# Patient Record
Sex: Female | Born: 1985
Health system: Southern US, Community
[De-identification: ages and names within clinical notes are randomized; demographics above are authoritative.]

## PROBLEM LIST (undated history)

## (undated) DIAGNOSIS — Z789 Other specified health status: Secondary | ICD-10-CM

## (undated) DIAGNOSIS — Z8619 Personal history of other infectious and parasitic diseases: Secondary | ICD-10-CM

## (undated) HISTORY — PX: WISDOM TOOTH EXTRACTION: SHX21

---

## 2012-01-19 LAB — OB RESULTS CONSOLE ABO/RH: RH Type: POSITIVE

## 2012-01-19 LAB — OB RESULTS CONSOLE ANTIBODY SCREEN: Antibody Screen: NEGATIVE

## 2012-01-19 LAB — OB RESULTS CONSOLE RPR: RPR: NONREACTIVE

## 2012-05-28 ENCOUNTER — Emergency Department (HOSPITAL_COMMUNITY)
Admission: EM | Admit: 2012-05-28 | Discharge: 2012-05-28 | Disposition: A | Discharge status: Home / Self Care | Payer: 59 | Attending: Emergency Medicine | Admitting: Emergency Medicine

## 2012-05-28 ENCOUNTER — Encounter (HOSPITAL_COMMUNITY): Payer: Self-pay | Admitting: Emergency Medicine

## 2012-05-28 DIAGNOSIS — Z3490 Encounter for supervision of normal pregnancy, unspecified, unspecified trimester: Secondary | ICD-10-CM

## 2012-05-28 DIAGNOSIS — R059 Cough, unspecified: Secondary | ICD-10-CM | POA: Insufficient documentation

## 2012-05-28 DIAGNOSIS — J069 Acute upper respiratory infection, unspecified: Secondary | ICD-10-CM

## 2012-05-28 DIAGNOSIS — R6883 Chills (without fever): Secondary | ICD-10-CM | POA: Insufficient documentation

## 2012-05-28 DIAGNOSIS — R05 Cough: Secondary | ICD-10-CM | POA: Insufficient documentation

## 2012-05-28 DIAGNOSIS — O9989 Other specified diseases and conditions complicating pregnancy, childbirth and the puerperium: Secondary | ICD-10-CM | POA: Insufficient documentation

## 2012-05-28 DIAGNOSIS — IMO0001 Reserved for inherently not codable concepts without codable children: Secondary | ICD-10-CM | POA: Insufficient documentation

## 2012-05-28 DIAGNOSIS — J029 Acute pharyngitis, unspecified: Secondary | ICD-10-CM | POA: Insufficient documentation

## 2012-05-28 DIAGNOSIS — J3489 Other specified disorders of nose and nasal sinuses: Secondary | ICD-10-CM | POA: Insufficient documentation

## 2012-05-28 MED ORDER — OSELTAMIVIR PHOSPHATE 75 MG PO CAPS: 75.0000 mg | ORAL_CAPSULE | Freq: Two times a day (BID) | ORAL | Status: DC

## 2012-05-28 NOTE — ED Provider Notes (Signed)
Medical screening examination/treatment/procedure(s) were conducted as a shared visit with non-physician practitioner(s) and myself.  I personally evaluated the patient during the encounter Patient with flulike symptoms here who is [redacted] weeks pregnant. Bedside ultrasound showed normal fetal movement and normal heart rate of 140s. Patient was started on Tamiflu and given close followup with PCP  Gwyneth Sprout, MD 05/28/12 [phone]

## 2012-05-28 NOTE — ED Provider Notes (Signed)
History     CSN: [id number]  Arrival date & time 05/28/12  4098   First MD Initiated Contact with Patient 05/28/12 0945      No chief complaint on file.   (Consider location/radiation/quality/duration/timing/severity/associated sxs/prior treatment) HPI Britini E Schlueter is a 26 y.o. female who presents to ED complaining of URI symptoms. Pt reports nasal congestion, sore throat, dry non productive cough, chills at home. Did not take her temp. States took tylenol right prior to coming in. Pt states she is [redacted]wks pregnant. 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 States called her doctor, was told to come here for evaluation. Pt denies any n/v/d. No abdominal pain. Feels her baby moving well. No vaginal discharge, bleeding. No headache, nuchal rigidity. No other complaints.  History reviewed. No pertinent past medical history.  No past surgical history on file.  History reviewed. No pertinent family history.  History  Substance Use Topics  . Smoking status: Not on file  . Smokeless tobacco: Not on file  . Alcohol Use: Not on file    OB History    Grav Para Term Preterm Abortions TAB SAB Ect Mult Living                  Review of Systems  Constitutional: Positive for chills.  HENT: Positive for congestion and sore throat. Negative for ear pain, neck pain and neck stiffness.   Respiratory: Positive for cough. Negative for shortness of breath and wheezing.   Cardiovascular: Negative.   Gastrointestinal: Negative.   Genitourinary: Negative for dysuria and flank pain.  Musculoskeletal: Positive for myalgias.  Skin: Negative for rash.  Neurological: Negative for dizziness, weakness and headaches.    Allergies  Review of patient's allergies indicates no known allergies.  Home Medications  No current outpatient prescriptions on file.  BP 126/76  Pulse 110  Temp 98.7 F (37.1 C) (Oral)  Resp 16  SpO2 100%  LMP 11/14/2011  Physical Exam   Nursing note and vitals reviewed. Constitutional: She appears well-developed and well-nourished. No distress.  HENT:  Right Ear: Tympanic membrane, external ear and ear canal normal.  Left Ear: Tympanic membrane, external ear and ear canal normal.  Nose: Rhinorrhea present.  Mouth/Throat: Uvula is midline and mucous membranes are normal. Posterior oropharyngeal erythema present. No oropharyngeal exudate, posterior oropharyngeal edema or tonsillar abscesses.  Eyes: Conjunctivae normal are normal. Pupils are equal, round, and reactive to light.  Neck: Normal range of motion. Neck supple.  Cardiovascular: Normal rate, regular rhythm and normal heart sounds.   Pulmonary/Chest: Effort normal and breath sounds normal. No respiratory distress. She has no wheezes. She has no rales.  Abdominal: Soft. Bowel sounds are normal. There is no tenderness. There is no rebound and no guarding.       gravid  Musculoskeletal: She exhibits no edema.  Lymphadenopathy:    She has no cervical adenopathy.  Neurological: She is alert.  Skin: Skin is warm and dry.  Psychiatric: She has a normal mood and affect.    ED Course  Procedures (including critical care time)    1. Viral URI   2. Pregnancy       MDM  Pt with flu like symptoms. No exam findings suggestive of pneumonia, meningitis, or any other emergent condition. Most likely viral URI vs flu, pt is at risk given pt's exposure. She did have her flu shot this year. Given she is pregnant, will treat with tamiflu. Supportive treatment,  oral fluids at home. She is non toxic appearing. Bed side [place] used by dr. Madlyn Frankel to assess baby's fetal heart rate, and it was normal in 140-150s. Will d/c home with work note and follow up.           Lottie Mussel, PA 05/28/12 1559

## 2012-05-28 NOTE — ED Notes (Signed)
Pt  Here with c/o flu like symptoms x1 day. Reports runny nose, dry cough, sore throat. Pt reports pregnant x28 weeks.

## 2012-07-12 ENCOUNTER — Inpatient Hospital Stay (HOSPITAL_COMMUNITY): Admission: AD | Admit: 2012-07-12 | Payer: 59 | Source: Ambulatory Visit | Admitting: Obstetrics & Gynecology

## 2012-07-25 LAB — OB RESULTS CONSOLE GBS: GBS: NEGATIVE

## 2012-08-19 ENCOUNTER — Inpatient Hospital Stay (HOSPITAL_COMMUNITY)
Admission: RE | Admit: 2012-08-19 | Discharge: 2012-08-22 | DRG: 765 | Disposition: A | Discharge status: Home / Self Care | Payer: 59 | Source: Ambulatory Visit | Attending: Obstetrics & Gynecology | Admitting: Obstetrics & Gynecology

## 2012-08-19 ENCOUNTER — Encounter (HOSPITAL_COMMUNITY): Payer: Self-pay

## 2012-08-19 DIAGNOSIS — D689 Coagulation defect, unspecified: Secondary | ICD-10-CM | POA: Diagnosis not present

## 2012-08-19 DIAGNOSIS — O403XX0 Polyhydramnios, third trimester, not applicable or unspecified: Secondary | ICD-10-CM | POA: Diagnosis present

## 2012-08-19 DIAGNOSIS — O324XX0 Maternal care for high head at term, not applicable or unspecified: Secondary | ICD-10-CM | POA: Diagnosis present

## 2012-08-19 DIAGNOSIS — D696 Thrombocytopenia, unspecified: Secondary | ICD-10-CM | POA: Diagnosis not present

## 2012-08-19 DIAGNOSIS — O409XX0 Polyhydramnios, unspecified trimester, not applicable or unspecified: Secondary | ICD-10-CM | POA: Diagnosis present

## 2012-08-19 HISTORY — DX: Other specified health status: Z78.9

## 2012-08-19 LAB — CBC
MCH: 31.7 pg (ref 26.0–34.0)
Platelets: 125 10*3/uL — ABNORMAL LOW (ref 150–400)
RBC: 4.1 MIL/uL (ref 3.87–5.11)
WBC: 9.7 10*3/uL (ref 4.0–10.5)

## 2012-08-19 MED ORDER — OXYTOCIN BOLUS FROM INFUSION: 500.0000 mL | INTRAVENOUS | Status: DC

## 2012-08-19 MED ORDER — TERBUTALINE SULFATE 1 MG/ML IJ SOLN
0.2500 mg | Freq: Once | INTRAMUSCULAR | Status: AC | PRN
  Filled 2012-08-19: qty 1

## 2012-08-19 MED ORDER — DIPHENHYDRAMINE HCL 25 MG PO CAPS
25.0000 mg | ORAL_CAPSULE | Freq: Every evening | ORAL | Status: DC | PRN
  Administered 2012-08-20: 25 mg via ORAL
  Filled 2012-08-19 (×2): qty 1

## 2012-08-19 MED ORDER — CALCIUM CARBONATE ANTACID 500 MG PO CHEW
1.0000 | CHEWABLE_TABLET | Freq: Three times a day (TID) | ORAL | Status: DC
  Administered 2012-08-19 – 2012-08-21 (×5): 200 mg via ORAL
  Filled 2012-08-19 (×4): qty 1
  Filled 2012-08-19: qty 2

## 2012-08-19 MED ORDER — ONDANSETRON HCL 4 MG/2ML IJ SOLN
4.0000 mg | Freq: Four times a day (QID) | INTRAMUSCULAR | Status: DC | PRN
  Administered 2012-08-20: 4 mg via INTRAVENOUS
  Filled 2012-08-19: qty 2

## 2012-08-19 MED ORDER — LACTATED RINGERS IV SOLN
INTRAVENOUS | Status: DC
  Administered 2012-08-19: 20:00:00 via INTRAVENOUS
  Administered 2012-08-20: 125 mL/h via INTRAVENOUS
  Administered 2012-08-20: 03:00:00 via INTRAVENOUS

## 2012-08-19 MED ORDER — CITRIC ACID-SODIUM CITRATE 334-500 MG/5ML PO SOLN
30.0000 mL | ORAL | Status: DC | PRN
  Administered 2012-08-20: 30 mL via ORAL
  Filled 2012-08-19: qty 15

## 2012-08-19 MED ORDER — ACETAMINOPHEN 325 MG PO TABS: 650.0000 mg | ORAL_TABLET | ORAL | Status: DC | PRN

## 2012-08-19 MED ORDER — IBUPROFEN 600 MG PO TABS: 600.0000 mg | ORAL_TABLET | Freq: Four times a day (QID) | ORAL | Status: DC | PRN

## 2012-08-19 MED ORDER — LIDOCAINE HCL (PF) 1 % IJ SOLN
30.0000 mL | INTRAMUSCULAR | Status: DC | PRN
  Filled 2012-08-19: qty 30

## 2012-08-19 MED ORDER — OXYTOCIN 40 UNITS IN LACTATED RINGERS INFUSION - SIMPLE MED
62.5000 mL/h | INTRAVENOUS | Status: DC
  Filled 2012-08-19: qty 1000

## 2012-08-19 MED ORDER — OXYCODONE-ACETAMINOPHEN 5-325 MG PO TABS: 1.0000 | ORAL_TABLET | ORAL | Status: DC | PRN

## 2012-08-19 MED ORDER — ZOLPIDEM TARTRATE 5 MG PO TABS: 5.0000 mg | ORAL_TABLET | Freq: Every evening | ORAL | Status: DC | PRN

## 2012-08-19 MED ORDER — LACTATED RINGERS IV SOLN: 500.0000 mL | INTRAVENOUS | Status: DC | PRN

## 2012-08-19 MED ORDER — MISOPROSTOL 25 MCG QUARTER TABLET
25.0000 ug | ORAL_TABLET | ORAL | Status: DC | PRN
  Administered 2012-08-19 – 2012-08-20 (×2): 25 ug via VAGINAL
  Filled 2012-08-19 (×2): qty 0.25

## 2012-08-19 NOTE — H&P (Signed)
Gerardine E Holst is a 27 y.o. 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  Uncomplicated PNCare, all nl labs, no GDM. Noted S>D and tense uterus for which sono was done, AFI 26 cm at 37 wks and went down to 23 cm at 39.5 wks.  Back pain and LE swelling, GERD; otherwise no bleeding/leaking/contractions. Feels lot of FMs.   History OB History   Grav Para Term Preterm Abortions TAB SAB Ect Mult Living   1              Past Medical History  Diagnosis Date  . Medical history non-contributory    Past Surgical History  Procedure Laterality Date  . Wisdom tooth extraction     Family History: family history is not on file. Social History:  reports that she has never smoked. She has never used smokeless tobacco. She reports that she does not drink alcohol or use illicit drugs.   Prenatal Transfer Tool  Maternal Diabetes: No Genetic Screening: Normal Maternal Ultrasounds/Referrals: Abnormal:  Findings:   Other: Polyhydramnios noted at 37 wks, normal anatomy Fetal Ultrasounds or other Referrals:  None Maternal Substance Abuse:  No Significant Maternal Medications:  None Significant Maternal Lab Results:  Lab values include: Group B Strep negative  Review of Systems  Eyes: Negative for blurred vision.  Respiratory: Negative for shortness of breath.   Cardiovascular: Negative for chest pain.  Gastrointestinal: Positive for heartburn.  Musculoskeletal: Positive for back pain.  Neurological: Negative for headaches.  Psychiatric/Behavioral: Negative for depression.    Exam Physical Exam  Blood pressure 133/93, pulse 103, temperature 98 F (36.7 C), temperature source Oral, resp. rate 18, height 5\' 6"  (1.676 m), weight 149 lb (67.586 kg), last menstrual period 11/14/2011.  A&O x 3, no acute distress. Pleasant HEENT neg Lungs CTA bilat CV RRR, S1S2 normal Abdo soft, non tender, non acute Extr +2 edema,  no tenderness Pelvic Cx closed/long/ stn -4/ Vtx  (confirmed with bedside sono). Cytotec 25 mcg placed in vagina FHT  160s/ + accels/ no decels/ moderate variability- category I Toco none  Prenatal labs: ABO, Rh: A/Positive/-- (08/14 0000) Antibody: Negative (08/14 0000) Rubella: Immune (08/14 0000) RPR: Nonreactive (08/14 0000)  HBsAg: Negative (08/14 0000)  HIV: Non-reactive (08/14 0000)  GBS: Negative (02/18 0000)  Glucola nl Ultrascreen neg, AFP1 normal. Anatomy sono - female, normal, good interval growth, last sono 3/15 suspected 8 +lbs.    Assessment/Plan:  27 yo G1 at 40 wks (LMP c/w sono), with idiopathic polyhydramnios, here for IOL. Cytotec to ripening, pitocin when cx improved. Plan early controlled AROM to prevent cord prolapse and abruption, patient understands and agrees.    Quantisha Marsicano R 08/19/2012, 9:26 PM

## 2012-08-20 ENCOUNTER — Encounter (HOSPITAL_COMMUNITY): Payer: Self-pay | Admitting: Anesthesiology

## 2012-08-20 ENCOUNTER — Inpatient Hospital Stay (HOSPITAL_COMMUNITY): Payer: 59 | Admitting: Anesthesiology

## 2012-08-20 ENCOUNTER — Encounter (HOSPITAL_COMMUNITY)
Admission: RE | Disposition: A | Discharge status: Home / Self Care | Payer: Self-pay | Source: Ambulatory Visit | Attending: Obstetrics & Gynecology

## 2012-08-20 ENCOUNTER — Encounter (HOSPITAL_COMMUNITY): Payer: Self-pay

## 2012-08-20 DIAGNOSIS — O403XX0 Polyhydramnios, third trimester, not applicable or unspecified: Secondary | ICD-10-CM | POA: Diagnosis present

## 2012-08-20 LAB — CBC
HCT: 38.7 % (ref 36.0–46.0)
MCH: 31.2 pg (ref 26.0–34.0)
MCHC: 33.8 g/dL (ref 30.0–36.0)
MCV: 91.3 fL (ref 78.0–100.0)
MCV: 92.3 fL (ref 78.0–100.0)
Platelets: 106 10*3/uL — ABNORMAL LOW (ref 150–400)
Platelets: 108 10*3/uL — ABNORMAL LOW (ref 150–400)
RBC: 4.24 MIL/uL (ref 3.87–5.11)
WBC: 12 10*3/uL — ABNORMAL HIGH (ref 4.0–10.5)

## 2012-08-20 LAB — TYPE AND SCREEN: ABO/RH(D): A POS

## 2012-08-20 LAB — RPR: RPR Ser Ql: NONREACTIVE

## 2012-08-20 SURGERY — Surgical Case
Anesthesia: Epidural | Wound class: Clean Contaminated

## 2012-08-20 MED ORDER — KETOROLAC TROMETHAMINE 60 MG/2ML IM SOLN: 60.0000 mg | Freq: Once | INTRAMUSCULAR | Status: DC | PRN

## 2012-08-20 MED ORDER — MENTHOL 3 MG MT LOZG: 1.0000 | LOZENGE | OROMUCOSAL | Status: DC | PRN

## 2012-08-20 MED ORDER — MORPHINE SULFATE 0.5 MG/ML IJ SOLN
INTRAMUSCULAR | Status: AC
  Filled 2012-08-20: qty 10

## 2012-08-20 MED ORDER — BUTORPHANOL TARTRATE 1 MG/ML IJ SOLN
1.0000 mg | INTRAMUSCULAR | Status: DC | PRN
  Administered 2012-08-20: 1 mg via INTRAVENOUS
  Filled 2012-08-20 (×2): qty 1

## 2012-08-20 MED ORDER — KETOROLAC TROMETHAMINE 30 MG/ML IJ SOLN: 30.0000 mg | Freq: Four times a day (QID) | INTRAMUSCULAR | Status: DC | PRN

## 2012-08-20 MED ORDER — DEXAMETHASONE SODIUM PHOSPHATE 10 MG/ML IJ SOLN
INTRAMUSCULAR | Status: DC | PRN
  Administered 2012-08-20: 10 mg via INTRAVENOUS

## 2012-08-20 MED ORDER — SODIUM CHLORIDE 0.9 % IJ SOLN
3.0000 mL | INTRAMUSCULAR | Status: DC | PRN
  Administered 2012-08-21: 3 mL via INTRAVENOUS

## 2012-08-20 MED ORDER — SCOPOLAMINE 1 MG/3DAYS TD PT72
1.0000 | MEDICATED_PATCH | Freq: Once | TRANSDERMAL | Status: DC
  Administered 2012-08-20: 1.5 mg via TRANSDERMAL

## 2012-08-20 MED ORDER — CEFAZOLIN SODIUM-DEXTROSE 2-3 GM-% IV SOLR
2.0000 g | INTRAVENOUS | Status: AC
  Administered 2012-08-20: 2 g via INTRAVENOUS
  Filled 2012-08-20: qty 50

## 2012-08-20 MED ORDER — SIMETHICONE 80 MG PO CHEW
80.0000 mg | CHEWABLE_TABLET | Freq: Three times a day (TID) | ORAL | Status: DC
  Administered 2012-08-20 – 2012-08-22 (×6): 80 mg via ORAL

## 2012-08-20 MED ORDER — FAMOTIDINE 20 MG PO TABS
20.0000 mg | ORAL_TABLET | Freq: Every day | ORAL | Status: DC | PRN
  Filled 2012-08-20: qty 1

## 2012-08-20 MED ORDER — MEPERIDINE HCL 25 MG/ML IJ SOLN: 6.2500 mg | INTRAMUSCULAR | Status: DC | PRN

## 2012-08-20 MED ORDER — FENTANYL CITRATE 0.05 MG/ML IJ SOLN: 25.0000 ug | INTRAMUSCULAR | Status: DC | PRN

## 2012-08-20 MED ORDER — SENNOSIDES-DOCUSATE SODIUM 8.6-50 MG PO TABS
2.0000 | ORAL_TABLET | Freq: Every day | ORAL | Status: DC
  Administered 2012-08-20 – 2012-08-21 (×2): 2 via ORAL

## 2012-08-20 MED ORDER — ZOLPIDEM TARTRATE 5 MG PO TABS: 5.0000 mg | ORAL_TABLET | Freq: Every evening | ORAL | Status: DC | PRN

## 2012-08-20 MED ORDER — FENTANYL 2.5 MCG/ML BUPIVACAINE 1/10 % EPIDURAL INFUSION (WH - ANES): 14.0000 mL/h | INTRAMUSCULAR | Status: DC | PRN

## 2012-08-20 MED ORDER — DIBUCAINE 1 % RE OINT: 1.0000 "application " | TOPICAL_OINTMENT | RECTAL | Status: DC | PRN

## 2012-08-20 MED ORDER — SCOPOLAMINE 1 MG/3DAYS TD PT72
MEDICATED_PATCH | TRANSDERMAL | Status: AC
  Filled 2012-08-20: qty 1

## 2012-08-20 MED ORDER — OXYCODONE-ACETAMINOPHEN 5-325 MG PO TABS: 1.0000 | ORAL_TABLET | ORAL | Status: DC | PRN

## 2012-08-20 MED ORDER — SODIUM BICARBONATE 8.4 % IV SOLN
INTRAVENOUS | Status: AC
  Filled 2012-08-20: qty 50

## 2012-08-20 MED ORDER — PHENYLEPHRINE 40 MCG/ML (10ML) SYRINGE FOR IV PUSH (FOR BLOOD PRESSURE SUPPORT): 80.0000 ug | PREFILLED_SYRINGE | INTRAVENOUS | Status: DC | PRN

## 2012-08-20 MED ORDER — DIPHENHYDRAMINE HCL 25 MG PO CAPS
25.0000 mg | ORAL_CAPSULE | ORAL | Status: DC | PRN
  Filled 2012-08-20: qty 1

## 2012-08-20 MED ORDER — TETANUS-DIPHTH-ACELL PERTUSSIS 5-2.5-18.5 LF-MCG/0.5 IM SUSP: 0.5000 mL | Freq: Once | INTRAMUSCULAR | Status: DC

## 2012-08-20 MED ORDER — WITCH HAZEL-GLYCERIN EX PADS: 1.0000 "application " | MEDICATED_PAD | CUTANEOUS | Status: DC | PRN

## 2012-08-20 MED ORDER — ONDANSETRON HCL 4 MG PO TABS: 4.0000 mg | ORAL_TABLET | ORAL | Status: DC | PRN

## 2012-08-20 MED ORDER — MEPERIDINE HCL 25 MG/ML IJ SOLN
INTRAMUSCULAR | Status: DC | PRN
  Administered 2012-08-20 (×2): 12.5 mg via INTRAVENOUS

## 2012-08-20 MED ORDER — LACTATED RINGERS IV SOLN
500.0000 mL | Freq: Once | INTRAVENOUS | Status: AC
  Administered 2012-08-20: 500 mL via INTRAVENOUS

## 2012-08-20 MED ORDER — NALBUPHINE HCL 10 MG/ML IJ SOLN
5.0000 mg | INTRAMUSCULAR | Status: DC | PRN
  Filled 2012-08-20: qty 1

## 2012-08-20 MED ORDER — OXYTOCIN 10 UNIT/ML IJ SOLN
INTRAMUSCULAR | Status: AC
  Filled 2012-08-20: qty 4

## 2012-08-20 MED ORDER — LIDOCAINE-EPINEPHRINE (PF) 2 %-1:200000 IJ SOLN
INTRAMUSCULAR | Status: AC
  Filled 2012-08-20: qty 20

## 2012-08-20 MED ORDER — ONDANSETRON HCL 4 MG/2ML IJ SOLN: 4.0000 mg | INTRAMUSCULAR | Status: DC | PRN

## 2012-08-20 MED ORDER — SODIUM BICARBONATE 8.4 % IV SOLN
INTRAVENOUS | Status: DC | PRN
  Administered 2012-08-20: 5 mL via EPIDURAL

## 2012-08-20 MED ORDER — LANOLIN HYDROUS EX OINT: 1.0000 "application " | TOPICAL_OINTMENT | CUTANEOUS | Status: DC | PRN

## 2012-08-20 MED ORDER — DIPHENHYDRAMINE HCL 25 MG PO CAPS
25.0000 mg | ORAL_CAPSULE | Freq: Four times a day (QID) | ORAL | Status: DC | PRN
  Administered 2012-08-21: 25 mg via ORAL

## 2012-08-20 MED ORDER — TERBUTALINE SULFATE 1 MG/ML IJ SOLN
0.2500 mg | Freq: Once | INTRAMUSCULAR | Status: AC
  Administered 2012-08-20: 0.25 mg via SUBCUTANEOUS

## 2012-08-20 MED ORDER — FAMOTIDINE IN NACL 20-0.9 MG/50ML-% IV SOLN
20.0000 mg | Freq: Once | INTRAVENOUS | Status: AC
  Administered 2012-08-20: 20 mg via INTRAVENOUS
  Filled 2012-08-20: qty 50

## 2012-08-20 MED ORDER — IBUPROFEN 600 MG PO TABS
600.0000 mg | ORAL_TABLET | Freq: Four times a day (QID) | ORAL | Status: DC
  Administered 2012-08-21 – 2012-08-22 (×5): 600 mg via ORAL
  Filled 2012-08-20: qty 1

## 2012-08-20 MED ORDER — DIPHENHYDRAMINE HCL 50 MG/ML IJ SOLN: 12.5000 mg | INTRAMUSCULAR | Status: DC | PRN

## 2012-08-20 MED ORDER — OXYTOCIN 10 UNIT/ML IJ SOLN
40.0000 [IU] | INTRAVENOUS | Status: DC | PRN
  Administered 2012-08-20: 40 [IU] via INTRAVENOUS

## 2012-08-20 MED ORDER — MORPHINE SULFATE (PF) 0.5 MG/ML IJ SOLN
INTRAMUSCULAR | Status: DC | PRN
  Administered 2012-08-20: 4 mg via EPIDURAL

## 2012-08-20 MED ORDER — LACTATED RINGERS IV SOLN: INTRAVENOUS | Status: DC

## 2012-08-20 MED ORDER — NALOXONE HCL 1 MG/ML IJ SOLN: 1.0000 ug/kg/h | INTRAVENOUS | Status: DC | PRN

## 2012-08-20 MED ORDER — IBUPROFEN 600 MG PO TABS
600.0000 mg | ORAL_TABLET | Freq: Four times a day (QID) | ORAL | Status: DC | PRN
  Administered 2012-08-22: 600 mg via ORAL
  Filled 2012-08-20 (×6): qty 1

## 2012-08-20 MED ORDER — ONDANSETRON HCL 4 MG/2ML IJ SOLN
INTRAMUSCULAR | Status: DC | PRN
  Administered 2012-08-20: 4 mg via INTRAVENOUS

## 2012-08-20 MED ORDER — EPHEDRINE 5 MG/ML INJ
10.0000 mg | INTRAVENOUS | Status: DC | PRN
  Filled 2012-08-20: qty 4

## 2012-08-20 MED ORDER — FENTANYL 2.5 MCG/ML BUPIVACAINE 1/10 % EPIDURAL INFUSION (WH - ANES)
14.0000 mL/h | INTRAMUSCULAR | Status: DC | PRN
  Administered 2012-08-20: 14 mL/h via EPIDURAL
  Filled 2012-08-20: qty 125

## 2012-08-20 MED ORDER — SIMETHICONE 80 MG PO CHEW: 80.0000 mg | CHEWABLE_TABLET | ORAL | Status: DC | PRN

## 2012-08-20 MED ORDER — PRENATAL MULTIVITAMIN CH
1.0000 | ORAL_TABLET | Freq: Every day | ORAL | Status: DC
  Administered 2012-08-21: 1 via ORAL
  Filled 2012-08-20 (×2): qty 1

## 2012-08-20 MED ORDER — PHENYLEPHRINE 40 MCG/ML (10ML) SYRINGE FOR IV PUSH (FOR BLOOD PRESSURE SUPPORT)
80.0000 ug | PREFILLED_SYRINGE | INTRAVENOUS | Status: DC | PRN
  Filled 2012-08-20: qty 5

## 2012-08-20 MED ORDER — DIPHENHYDRAMINE HCL 50 MG/ML IJ SOLN: 25.0000 mg | INTRAMUSCULAR | Status: DC | PRN

## 2012-08-20 MED ORDER — ONDANSETRON HCL 4 MG/2ML IJ SOLN: 4.0000 mg | Freq: Three times a day (TID) | INTRAMUSCULAR | Status: DC | PRN

## 2012-08-20 MED ORDER — OXYTOCIN 40 UNITS IN LACTATED RINGERS INFUSION - SIMPLE MED: 62.5000 mL/h | INTRAVENOUS | Status: AC

## 2012-08-20 MED ORDER — LIDOCAINE HCL (PF) 1 % IJ SOLN
INTRAMUSCULAR | Status: DC | PRN
  Administered 2012-08-20 (×4): 4 mL

## 2012-08-20 MED ORDER — METOCLOPRAMIDE HCL 5 MG/ML IJ SOLN: 10.0000 mg | Freq: Three times a day (TID) | INTRAMUSCULAR | Status: DC | PRN

## 2012-08-20 MED ORDER — EPHEDRINE 5 MG/ML INJ: 10.0000 mg | INTRAVENOUS | Status: DC | PRN

## 2012-08-20 MED ORDER — MORPHINE SULFATE (PF) 0.5 MG/ML IJ SOLN
INTRAMUSCULAR | Status: DC | PRN
  Administered 2012-08-20: 1 mg via INTRAVENOUS

## 2012-08-20 MED ORDER — BUTORPHANOL TARTRATE 1 MG/ML IJ SOLN
1.0000 mg | INTRAMUSCULAR | Status: AC
  Administered 2012-08-20: 1 mg via INTRAVENOUS

## 2012-08-20 MED ORDER — LACTATED RINGERS IV SOLN
INTRAVENOUS | Status: DC | PRN
  Administered 2012-08-20 (×3): via INTRAVENOUS

## 2012-08-20 MED ORDER — NALOXONE HCL 0.4 MG/ML IJ SOLN: 0.4000 mg | INTRAMUSCULAR | Status: DC | PRN

## 2012-08-20 MED ORDER — MEPERIDINE HCL 25 MG/ML IJ SOLN
INTRAMUSCULAR | Status: AC
  Filled 2012-08-20: qty 1

## 2012-08-20 MED ORDER — ONDANSETRON HCL 4 MG/2ML IJ SOLN
INTRAMUSCULAR | Status: AC
  Filled 2012-08-20: qty 2

## 2012-08-20 SURGICAL SUPPLY — 38 items
BENZOIN TINCTURE PRP APPL 2/3 (GAUZE/BANDAGES/DRESSINGS) ×2 IMPLANT
CLOTH BEACON ORANGE TIMEOUT ST (SAFETY) ×2 IMPLANT
CONTAINER PREFILL 10% NBF 15ML (MISCELLANEOUS) IMPLANT
DRAPE LG THREE QUARTER DISP (DRAPES) ×2 IMPLANT
DRSG OPSITE 6X11 MED (GAUZE/BANDAGES/DRESSINGS) ×2 IMPLANT
DRSG OPSITE POSTOP 4X10 (GAUZE/BANDAGES/DRESSINGS) ×2 IMPLANT
DURAPREP 26ML APPLICATOR (WOUND CARE) ×2 IMPLANT
ELECT REM PT RETURN 9FT ADLT (ELECTROSURGICAL) ×2
ELECTRODE REM PT RTRN 9FT ADLT (ELECTROSURGICAL) ×1 IMPLANT
EXTRACTOR VACUUM KIWI (MISCELLANEOUS) IMPLANT
EXTRACTOR VACUUM M CUP 4 TUBE (SUCTIONS) IMPLANT
GLOVE BIO SURGEON STRL SZ7 (GLOVE) ×2 IMPLANT
GLOVE BIOGEL PI IND STRL 7.0 (GLOVE) ×1 IMPLANT
GLOVE BIOGEL PI INDICATOR 7.0 (GLOVE) ×1
GOWN STRL REIN XL XLG (GOWN DISPOSABLE) ×4 IMPLANT
KIT ABG SYR 3ML LUER SLIP (SYRINGE) IMPLANT
NEEDLE HYPO 25X5/8 SAFETYGLIDE (NEEDLE) IMPLANT
NS IRRIG 1000ML POUR BTL (IV SOLUTION) ×2 IMPLANT
PACK C SECTION WH (CUSTOM PROCEDURE TRAY) ×2 IMPLANT
PAD OB MATERNITY 4.3X12.25 (PERSONAL CARE ITEMS) ×2 IMPLANT
RTRCTR C-SECT PINK 25CM LRG (MISCELLANEOUS) IMPLANT
SLEEVE SCD COMPRESS KNEE MED (MISCELLANEOUS) IMPLANT
STAPLER VISISTAT 35W (STAPLE) IMPLANT
STRIP CLOSURE SKIN 1/4X4 (GAUZE/BANDAGES/DRESSINGS) IMPLANT
SUT MNCRL 0 VIOLET CTX 36 (SUTURE) ×3 IMPLANT
SUT MONOCRYL 0 CTX 36 (SUTURE) ×3
SUT PLAIN 0 NONE (SUTURE) IMPLANT
SUT PLAIN 2 0 (SUTURE) ×1
SUT PLAIN ABS 2-0 CT1 27XMFL (SUTURE) ×1 IMPLANT
SUT VIC AB 0 CT1 27 (SUTURE) ×2
SUT VIC AB 0 CT1 27XBRD ANBCTR (SUTURE) ×2 IMPLANT
SUT VIC AB 2-0 CT1 27 (SUTURE) ×2
SUT VIC AB 2-0 CT1 TAPERPNT 27 (SUTURE) ×2 IMPLANT
SUT VIC AB 4-0 KS 27 (SUTURE) IMPLANT
SUT VICRYL 0 TIES 12 18 (SUTURE) IMPLANT
TOWEL OR 17X24 6PK STRL BLUE (TOWEL DISPOSABLE) ×6 IMPLANT
TRAY FOLEY CATH 14FR (SET/KITS/TRAYS/PACK) IMPLANT
WATER STERILE IRR 1000ML POUR (IV SOLUTION) ×2 IMPLANT

## 2012-08-20 NOTE — Progress Notes (Signed)
Tonya Rice is a 27 y.o. G1P0 at [redacted]w[redacted]d, complete since 10.40 am, pushed only intermittently since OT and station was high. Now pushing again in left lateral since baby tolerates that position best.   BP 123/56  Pulse 96  Temp(Src) 97.4 F (36.3 C) (Axillary)  Resp 18  Ht 5\' 6"  (1.676 m)  Wt 149 lb (67.586 kg)  BMI 24.06 kg/m2  SpO2 100%  LMP 11/14/2011   Total I/O In: -  Out: 200 [Urine:200]  FHT:  FHR: 120s bpm, variability: moderate,  accelerations:  Present,  decelerations:  Present variable decels with pushing, down to 90s, good quick recovery to baseline  UC:   regular, every 3 minutes SVE:   Dilation: 10 Effacement (%): 100 Station: +1 (with capet) Exam by:: [email or web address] Station down to +2 with caput and moulding, continue to push since FHT-category I and continue to assess progress.   Slow progress in stage II, continue to push. EFW 7.1/2 lbs, OP.   Tonya Rice 08/20/2012, 2:37 PM

## 2012-08-20 NOTE — Progress Notes (Signed)
Pt sitting at bedside frequently tracing maternal

## 2012-08-20 NOTE — Anesthesia Preprocedure Evaluation (Addendum)
Anesthesia Evaluation  Patient identified by MRN, date of birth, ID band Patient awake    Reviewed: Allergy & Precautions, H&P , NPO status , Patient's Chart, lab work & pertinent test results, reviewed documented beta blocker date and time   History of Anesthesia Complications Negative for: history of anesthetic complications  Airway Mallampati: III TM Distance: >3 FB Neck ROM: full    Dental  (+) Teeth Intact   Pulmonary neg pulmonary ROS,  breath sounds clear to auscultation        Cardiovascular negative cardio ROS  Rhythm:regular Rate:Normal     Neuro/Psych negative neurological ROS  negative psych ROS   GI/Hepatic Neg liver ROS, GERD-  ,  Endo/Other  negative endocrine ROS  Renal/GU negative Renal ROS     Musculoskeletal   Abdominal   Peds  Hematology negative hematology ROS (+)   Anesthesia Other Findings   Reproductive/Obstetrics (+) Pregnancy (arrest of descent --> C/S)                          Anesthesia Physical Anesthesia Plan  ASA: II and emergent  Anesthesia Plan: Epidural   Post-op Pain Management:    Induction:   Airway Management Planned:   Additional Equipment:   Intra-op Plan:   Post-operative Plan:   Informed Consent: I have reviewed the patients History and Physical, chart, labs and discussed the procedure including the risks, benefits and alternatives for the proposed anesthesia with the patient or authorized representative who has indicated his/her understanding and acceptance.     Plan Discussed with: Surgeon and CRNA  Anesthesia Plan Comments:        Anesthesia Quick Evaluation

## 2012-08-20 NOTE — Op Note (Signed)
Cesarean Section Procedure Note Charlita E Danker 08/20/2012  Procedure: Primary Low Transverse Cesarean Section   Indications: Dystocia   Pre-operative Diagnosis: Arrest of Descent and Rotation, in second stage for over 4 hrs   Post-operative Diagnosis: Same Persistent occiput posterior   Surgeon: Robley Fries, MD - Primary   Assistants: None  Anesthesia: Epidural   Procedure Details:  The patient was seen in the Labor Room. The risks, benefits, complications, treatment options, and expected outcomes were discussed with the patient. The patient concurred with the proposed plan, giving informed consent. identified as Lorijean E Earnhart and the procedure verified as C-Section Delivery. A Time Out was held and the above information confirmed. 2 gm Ancef given before surgery. After induction of anesthesia, the patient was draped and prepped in the usual sterile manner. A Pfannenstiel incision was made and carried down through the subcutaneous tissue to the fascia. Fascial incision was made and extended transversely. The fascia was separated from the underlying rectus tissue superiorly and inferiorly. The peritoneum was identified and entered. Peritoneal incision was extended longitudinally. The utero-vesical peritoneal reflection was incised transversely and the bladder flap was bluntly freed from the lower uterine segment. Before uterine incision was made, [facility] fellow Dr Marina Goodell elevated the head up to the pelvic inlet. A low transverse uterine incision was then made and extended curving up. Head was delivered from the incision without difficulty and a nuchal cord was reduced over the head, birth time 16.42 hrs of a healthy viable Female infant, 8 lbs 4 oz, with Apgar scores of 8 at one minute and 9 at five minutes. Cord ph was not sent the umbilical cord was clamped and cut cord blood was obtained for evaluation. The placenta was removed Intact and appeared normal. The uterine incision  had no extensions and tubes and ovaries appeared normal. The uterine incision was closed with running locked sutures of 0 Monocryl followed by second imbricating layer. Hemostasis was observed. Peritoneum was closed with 2-0 Vicryl. Muscles approximated in midline. The fascia was then reapproximated with running sutures of 0Vicryl. The subcutaneous closure was performed using 2-0plain gut. The skin was closed with 4-0Vicryl. Steristrps applied and sterile dressing placed.  Instrument, sponge, and needle counts were correct prior the abdominal closure and were correct at the conclusion of the case.   Findings:  Female infant delivered at 16.42 hrs from Cephalic OP position, weight 8 lbs 4 oz, Apgar scores of 8 at one minute and 9 at five minutes. No uterine extensions. Normal tubes and ovaries. Normal placenta, 3 vessel cord. Loose nuchal cord reduced over the head after head delivery.    Estimated Blood Loss: 700 cc  Total IV Fluids: 2200 cc LR  Urine Output: 300 cc hematuria cleared   Specimens: Cord blood  Complications: None  Disposition: PACU - hemodynamically stable.   Maternal Condition: stable   Baby condition / location:  with mother, stable  Attending Attestation: I performed the procedure.   Signed: Surgeon(s): Robley Fries, MD

## 2012-08-20 NOTE — Progress Notes (Signed)
In O.R. 

## 2012-08-20 NOTE — Anesthesia Procedure Notes (Signed)
Epidural Patient location during procedure: OB Start time: 08/20/2012 9:16 AM  Staffing Performed by: anesthesiologist   Preanesthetic Checklist Completed: patient identified, site marked, surgical consent, pre-op evaluation, timeout performed, IV checked, risks and benefits discussed and monitors and equipment checked  Epidural Patient position: sitting Prep: site prepped and draped and DuraPrep Patient monitoring: continuous pulse ox and blood pressure Approach: midline Injection technique: LOR air  Needle:  Needle type: Tuohy  Needle gauge: 17 G Needle length: 9 cm and 9 Needle insertion depth: 5.5 cm Catheter type: closed end flexible Catheter size: 19 Gauge Catheter at skin depth: 10.5 cm Test dose: negative  Assessment Events: blood not aspirated, injection not painful, no injection resistance, negative IV test and no paresthesia  Additional Notes Discussed risk of headache, infection, bleeding, nerve injury and failed or incomplete block.  Patient voices understanding and wishes to proceed.  Epidural placed easily on first attempt.  No paresthesia.  Patient tolerated procedure well.  Profuse bleeding from skin puncture site, slowed with pressure.  Platelets 106 prior to epidural procedure (were 125 last night).  Will recheck prior to epidural removal.  Site covered with 2x2 gauze pads prior to taping.  Jasmine December, MDReason for block:procedure for pain

## 2012-08-20 NOTE — Progress Notes (Signed)
To OR

## 2012-08-20 NOTE — Transfer of Care (Signed)
Immediate Anesthesia Transfer of Care Note  Patient: Tonya Rice  Procedure(s) Performed: Procedure(s): CESAREAN SECTION (N/A)  Patient Location: PACU  Anesthesia Type:Epidural  Level of Consciousness: awake  Airway & Oxygen Therapy: Patient Spontanous Breathing  Post-op Assessment: Report given to PACU RN and Post -op Vital signs reviewed and stable  Post vital signs: stable  Complications: No apparent anesthesia complications

## 2012-08-20 NOTE — Progress Notes (Signed)
Tonya Rice is a 27 y.o. G1P0 at [redacted]w[redacted]d in second stage since 10.30 am.  Objective: BP 116/79  Pulse 98  Temp(Src) 98.5 F (36.9 C) (Oral)  Resp 16  Ht 5\' 6"  (1.676 m)  Wt 149 lb (67.586 kg)  BMI 24.06 kg/m2  SpO2 100%  LMP 11/14/2011   Total I/O In: -  Out: 200 [Urine:200]  FHT: 120s/ + accels/ decel with UCs and pushing/  UC: Regular 3 min.  SVE: Complete, 0 station OT. Small caput and moulding ++. Several pushing efforts with no change in rotation or station. Exaggerated Sims and attempt rotation.   Assessment / Plan: Persistent OT, no progress in rotation and station. Reassess 1 hr.   Jaisa Defino R 08/20/2012, 12:49 PM

## 2012-08-20 NOTE — Progress Notes (Signed)
Tonya Rice is a 27 y.o. G1P0 at [redacted]w[redacted]d IOL for polyhydramnios. Progressed well to completely dilated at 10.40 am . Since then labored down and pushed intermittently with active pushing for over 2 hrs and no progress below +1 station. Not operative delivery candidate and patient declines it as well.  LOT to OT. Manual rotation not successful either.   VS stable. FHT 130s/ + accels/ decels are variables with pushing only/ moderate variability- category I  Toco- q 2-3 min, spontaneous (last Cytotec was at 1 am).   Complete close to 5 hrs, no progrees. Proceed with cesarean delivery.   Risks/complications of surgery reviewed incl infection, bleeding, damage to internal organs including bladder, bowels, ureters, blood vessels, other risks from anesthesia, VTE and delayed complications of any surgery, complications in future surgery reviewed. Also discussed neonatal complications incl difficult delivery, laceration, vacuum assistance, TTN etc. Pt understands and agrees, all concerns addressed.     Ivin Rosenbloom R 08/20/2012, 3:35 PM

## 2012-08-20 NOTE — Progress Notes (Signed)
Tonya Rice is a 27 y.o. G1P0 at [redacted]w[redacted]d. Comfortable with epidural  Objective: BP 111/68  Pulse 81  Temp(Src) 97.7 F (36.5 C) (Axillary)  Resp 20  Ht 5\' 6"  (1.676 m)  Wt 149 lb (67.586 kg)  BMI 24.06 kg/m2  SpO2 100%  LMP 11/14/2011     FHT:  FHR: 120 bpm, variability: moderate,  accelerations:  Present,  decelerations:  Absent UC:   regular, every 3 minutes SVE:   Dilation: 10 Effacement (%): 100 Station: -1 Exam by:: Dr Benedict Needy and caput. OT Exaggerated Sims given  Reassess progress, labor down 1 hr.   FHT- category I Labor progressed well, in second stage but not pushing since station -1, reassess 1 hr.   Tonya Rice R 08/20/2012, 11:49 AM

## 2012-08-20 NOTE — Progress Notes (Signed)
Tonya Rice is a 27 y.o. G1P0 at [redacted]w[redacted]d, IOL for polyhydramnios  BP 134/89  Pulse 83  Temp(Src) 97.9 F (36.6 C) (Oral)  Resp 16  Ht 5\' 6"  (1.676 m)  Wt 149 lb (67.586 kg)  BMI 24.06 kg/m2  LMP 11/14/2011   FHT: 120s/ + accels/ some early decels since AROM/ minimal variability- s/p stadol  SVE:   Dilation: 8 Effacement (%): 100 Station: -2 Exam by:: a. white rn Progressed well overnight with 2 Cytotec doses, last was at about 1 am. Contracting well. Cervix 8 cm, 100%/ Vtx, OT at -2/ AROM clear copious fluid.   Labs: Lab Results  Component Value Date   WBC 12.0* 08/20/2012   HGB 13.3 08/20/2012   HCT 38.7 08/20/2012   MCV 91.3 08/20/2012   PLT PENDING 08/20/2012    Assessment / Plan: Active labor, epidural now- ASAP once platelets back. Shea Evans R 08/20/2012, 8:49 AM

## 2012-08-20 NOTE — Anesthesia Postprocedure Evaluation (Signed)
  Anesthesia Post-op Note  Anesthesia Post Note  Patient: Tonya Rice  Procedure(s) Performed: Procedure(s) (LRB): CESAREAN SECTION (N/A)  Anesthesia type: Epidural  Patient location: PACU  Post pain: Pain level controlled  Post assessment: Post-op Vital signs reviewed  Last Vitals:  Filed Vitals:   08/20/12 1845  BP: 135/82  Pulse: 66  Temp:   Resp: 15    Post vital signs: stable  Level of consciousness: awake  Complications: No apparent anesthesia complications

## 2012-08-21 ENCOUNTER — Encounter (HOSPITAL_COMMUNITY): Payer: Self-pay

## 2012-08-21 LAB — CBC
MCHC: 34.1 g/dL (ref 30.0–36.0)
MCV: 91.6 fL (ref 78.0–100.0)
Platelets: 118 10*3/uL — ABNORMAL LOW (ref 150–400)
RDW: 13.9 % (ref 11.5–15.5)
WBC: 19.1 10*3/uL — ABNORMAL HIGH (ref 4.0–10.5)

## 2012-08-21 NOTE — Anesthesia Postprocedure Evaluation (Signed)
  Anesthesia Post-op Note  Patient: Tonya Rice  Procedure(s) Performed: Procedure(s): CESAREAN SECTION (N/A)  Patient Location: Mother/Baby  Anesthesia Type:Epidural  Level of Consciousness: awake  Airway and Oxygen Therapy: Patient Spontanous Breathing  Post-op Pain: none  Post-op Assessment: Patient's Cardiovascular Status Stable, Respiratory Function Stable, Patent Airway, No signs of Nausea or vomiting, Adequate PO intake, Pain level controlled, No headache, No backache, No residual numbness and No residual motor weakness  Post-op Vital Signs: Reviewed and stable  Complications: No apparent anesthesia complications

## 2012-08-21 NOTE — Progress Notes (Signed)
POSTOPERATIVE DAY # 1 S/P  Cesarean section   S:         Reports feeling well- itching some / rested "a little"             Tolerating po intake / no nausea / no vomiting / no flatus / no BM             Bleeding is light             Pain controlled with long-acting narcotic and motrin this am             Up ad lib / ambulatory/ voiding QS  Newborn breast feeding     O:  VS: BP 119/56  Pulse 70  Temp(Src) 98.3 F (36.8 C) (Oral)  Resp 18  Ht 5\' 6"  (1.676 m)  Wt 67.586 kg (149 lb)  BMI 24.06 kg/m2  SpO2 96%  LMP 11/14/2011   LABS:  Recent Labs  08/20/12 1750 08/21/12 0625  WBC 17.5* 19.1*  HGB 12.1 11.2*  PLT 108* 118*                      I&O: Intake/Output     03/16 0701 - 03/17 0700 03/17 0701 - 03/18 0700   I.V. (mL/kg) 2350 (34.8)    Total Intake(mL/kg) 2350 (34.8)    Urine (mL/kg/hr) 2400 (1.5)    Blood 700 (0.4)    Total Output 3100     Net -750                     Physical Exam:             Alert and Oriented X3  Lungs: Clear and unlabored  Heart: regular rate and rhythm / no mumurs  Abdomen: soft, non-tender, non-distended, hypoactive bowel sounds             Fundus: firm, non-tender, Ueven             Dressing intact honeycomb              Incision:  approximated with subcuticular / no erythema / no ecchymosis / small marked drainage without extension  Perineum: no edema  Lochia: light  Extremities: no edema, no calf pain or tenderness, negative Homans  A:        POD # 1 S/P cesarean section            Mild thrombocytopenia - gestational etiology (stable & rising / PLT 118 today)             P:        Routine postoperative care                Marlinda Mike CNM, MSN 08/21/2012, 8:12 AM

## 2012-08-22 MED ORDER — IBUPROFEN 600 MG PO TABS: 600.0000 mg | ORAL_TABLET | Freq: Four times a day (QID) | ORAL | Status: DC | PRN

## 2012-08-22 MED ORDER — SIMETHICONE 80 MG PO CHEW: 80.0000 mg | CHEWABLE_TABLET | Freq: Three times a day (TID) | ORAL | Status: AC

## 2012-08-22 NOTE — Progress Notes (Signed)
POSTOPERATIVE DAY # 2 S/P  PLTCS for arrest of descent and rotation.   S:         Reports feeling well             Tolerating po intake / no nausea / no vomiting / passing flatus / no BM as yet             Bleeding is light             Pain controlled withibuprofen (OTC) and narcotic analgesics including oxycodone/acetaminophen (Percocet, Tylox)             Up ad lib / ambulatory/ voiding QS  Newborn Breast feeding  / Circumcision to be completed today by Dr. Juliene Pina.   O:  VS: BP 118/75  Pulse 60  Temp(Src) 98.4 F (36.9 C) (Axillary)  Resp 18  Ht 5\' 6"  (1.676 m)  Wt 67.586 kg (149 lb)  BMI 24.06 kg/m2  SpO2 95%  LMP 11/14/2011   LABS:  Recent Labs  08/20/12 1750 08/21/12 0625  WBC 17.5* 19.1*  HGB 12.1 11.2*  PLT 108* 118*                           I&O: Intake/Output     03/17 0701 - 03/18 0700 03/18 0701 - 03/19 0700   P.O. 720    I.V. (mL/kg) 375 (5.5)    Total Intake(mL/kg) 1095 (16.2)    Urine (mL/kg/hr) 400 (0.2)    Blood     Total Output 400     Net +695          Urine Occurrence 1 x                 Physical Exam:             Alert and Oriented X3  Lungs: Clear and unlabored  Heart: regular rate and rhythm / no mumurs  Abdomen: soft, non-tender, non-distended B/s x 4             Fundus: firm, non-tender, U-2             Dressing: Honeycomb dressing remains in place             Incision:  approximated with suture / no erythema / no ecchymosis / no drainage  Perineum: Intact  Lochia: min, rubra  Extremities: no edema, no calf pain or tenderness, neg Homans  A:        POD # 2 S/P PLTCS            Both Mother and baby stable  P:        Routine postoperative care             Circumcision for newborn today by Dr. Juliene Pina.            Possible discharge this evening depending newborn circumcision and breastfeeding and maternal condition.     Thao Bauza CNM. 08/22/2012, 9:43 AM

## 2012-08-22 NOTE — Discharge Summary (Signed)
Physician Discharge Summary  Patient ID: Tonya Rice MRN: [id number] DOB/AGE: 07/14/1985 27 y.o.  Admit date: 08/19/2012 Discharge date: 08/22/2012  Admission Diagnoses: Patient admitted for IOL for Idiopathic Polyhydramnios at [redacted]w[redacted]d   Discharge Diagnoses:  Principal Problem:   Postpartum care following cesarean delivery (3/16) Active Problems:   Polyhydramnios in third trimester   Single delivery by cesarean section   Discharged Condition: stable  Hospital Course: Failed IOL  Resulting in PLTCS for failure to descend and rotate.  Consults: None  Significant Diagnostic Studies: labs: PN labs normal  and anatomy ultrsound normal  Treatments: IV hydration, antibiotics: Ancef and analgesia: ibuprofen  Discharge Exam: Blood pressure 118/75, pulse 60, temperature 98.4 F (36.9 C), temperature source Axillary, resp. rate 18, height 5\' 6"  (1.676 m), weight 67.586 kg (149 lb), last menstrual period 11/14/2011, SpO2 95.00%, unknown if currently breastfeeding.  Physical Examination:  General appearance: alert, cooperative and no distress Affect: AAO x 3 Lungs: CTAB Breasts: N/T CV: RRR Abdomen: soft, B/s x 4  Fundus: -2/u Incision: Dry and intact. Clear dressing removed as slight blood stained and new honeycomb dressing replaced. Patient advised re care of dressing and steri-strips. GI: tolerating normal diet GU: No problems voiding Lochia: Min, rubra Extremities: No edema/ no swelling bilaterally, upper and lower.      Disposition: 01-Home or Self Care  Discharge Orders   Future Orders Complete By Expires     Activity as tolerated  As directed     Call MD for:  difficulty breathing, headache or visual disturbances  As directed     Call MD for:  redness, tenderness, or signs of infection (pain, swelling, redness, odor or green/yellow discharge around incision site)  As directed     Call MD for:  severe uncontrolled pain  As directed     Call MD for:  temperature  >100.4  As directed     Diet general  As directed     Discharge instructions  As directed     Comments:      Per [facility]  Handbook    Discharge wound care:  As directed     Comments:      Clear dressing in place and remove gently in 2 -3 days    Driving restriction   As directed     Comments:      Avoid driving for approx 10 days    Lifting restrictions  As directed     Comments:      Weight restriction of 20 lbs.    Sexual acrtivity  As directed     Comments:      Advised not to have intercourse x 6 weeks        Medication List    STOP taking these medications       diphenhydrAMINE 25 MG tablet  Commonly known as:  BENADRYL     famotidine 20 MG tablet  Commonly known as:  PEPCID      TAKE these medications       ibuprofen 600 MG tablet  Commonly known as:  ADVIL,MOTRIN  Take 1 tablet (600 mg total) by mouth every 6 (six) hours as needed.     prenatal multivitamin Tabs  Take 1 tablet by mouth daily.     simethicone 80 MG chewable tablet  Commonly known as:  MYLICON  Chew 1 tablet (80 mg total) by mouth 4 (four) times daily - after meals and at bedtime.  Follow-up Information   Follow up with [facility] Schedule an appointment as soon as possible for a visit in 6 weeks. (As needed)    Contact information:   [address] [place] [address] [phone]      Signed: Earl Gala 08/22/2012, 5:23 PM  Reviewed and agree with note and plan. V.Adeli Frost, MD

## 2014-04-08 ENCOUNTER — Encounter (HOSPITAL_COMMUNITY): Payer: Self-pay

## 2014-10-12 ENCOUNTER — Emergency Department (HOSPITAL_COMMUNITY)
Admission: EM | Admit: 2014-10-12 | Discharge: 2014-10-12 | Disposition: A | Discharge status: Home / Self Care | Payer: 59 | Source: Home / Self Care | Attending: Family Medicine | Admitting: Family Medicine

## 2014-10-12 ENCOUNTER — Encounter (HOSPITAL_COMMUNITY): Payer: Self-pay

## 2014-10-12 DIAGNOSIS — J029 Acute pharyngitis, unspecified: Secondary | ICD-10-CM | POA: Diagnosis not present

## 2014-10-12 LAB — POCT RAPID STREP A: Streptococcus, Group A Screen (Direct): NEGATIVE

## 2014-10-12 MED ORDER — AMOXICILLIN 875 MG PO TABS
875.0000 mg | ORAL_TABLET | Freq: Two times a day (BID) | ORAL | Status: DC
Start: 2014-10-12 — End: 2016-11-29

## 2014-10-12 NOTE — ED Provider Notes (Signed)
CSN: [id number]     Arrival date & time 10/12/14  1044 History   First MD Initiated Contact with Patient 10/12/14 1109     Chief Complaint  Patient presents with  . Sore Throat   (Consider location/radiation/quality/duration/timing/severity/associated sxs/prior Treatment) Patient is a 29 y.o. female presenting with pharyngitis. The history is provided by the patient.  Sore Throat This is a new problem. The current episode started 12 to 24 hours ago. The problem occurs constantly. The problem has been rapidly worsening. Pertinent negatives include no chest pain, no abdominal pain, no headaches and no shortness of breath. Nothing aggravates the symptoms. Nothing relieves the symptoms. She has tried nothing for the symptoms.    Past Medical History  Diagnosis Date  . Medical history non-contributory    Past Surgical History  Procedure Laterality Date  . Wisdom tooth extraction    . Cesarean section N/A 08/20/2012    Procedure: CESAREAN SECTION;  Surgeon: Robley FriesVaishali R Mody, MD;  Location: [facility];  Service: Obstetrics;  Laterality: N/A;   History reviewed. No pertinent family history. History  Substance Use Topics  . Smoking status: Never Smoker   . Smokeless tobacco: Never Used  . Alcohol Use: No   OB History    Gravida Para Term Preterm AB TAB SAB Ectopic Multiple Living   1 1 1             Review of Systems  Constitutional: Positive for fever and chills.  HENT: Positive for ear pain.   Eyes: Negative.   Respiratory: Negative.  Negative for shortness of breath.   Cardiovascular: Negative.  Negative for chest pain.  Gastrointestinal: Negative.  Negative for abdominal pain.  Genitourinary: Negative.   Musculoskeletal: Positive for myalgias.  Neurological: Negative.  Negative for headaches.    Allergies  Review of patient's allergies indicates no known allergies.  Home Medications   Prior to Admission medications   Medication Sig Start Date End Date Taking? Authorizing  Provider  desloratadine (CLARINEX) 5 MG tablet Take 5 mg by mouth daily.   Yes Historical Provider, MD  ibuprofen (ADVIL,MOTRIN) 600 MG tablet Take 1 tablet (600 mg total) by mouth every 6 (six) hours as needed. 08/22/12   Earl Galaenise Davies, CNM  Prenatal Vit-Fe Fumarate-FA (PRENATAL MULTIVITAMIN) TABS Take 1 tablet by mouth daily.    Historical Provider, MD  simethicone (MYLICON) 80 MG chewable tablet Chew 1 tablet (80 mg total) by mouth 4 (four) times daily - after meals and at bedtime. 08/22/12   Earl Galaenise Davies, CNM   BP 137/85 mmHg  Pulse 102  Temp(Src) 98.4 F (36.9 C) (Oral)  Resp 14  SpO2 99%  LMP 09/29/2014 (Approximate) Physical Exam  Constitutional: She appears well-developed and well-nourished.  HENT:  Head: Normocephalic and atraumatic.  Right Ear: External ear normal.  Left Ear: External ear normal.  Very red and mildly swollen right tonsillar pillar  Neck: Neck supple.  Cardiovascular: Normal rate.   Pulmonary/Chest: Effort normal.  Lymphadenopathy:    She has cervical adenopathy.    ED Course  Procedures (including critical care time) Labs Review Labs Reviewed - No data to display  Imaging Review No results found.   MDM  .diag Pharyngitis - Plan: amoxicillin (AMOXIL) 875 MG tablet     Elvina SidleKurt Dawsyn Zurn, MD 10/12/14 1116

## 2014-10-12 NOTE — ED Notes (Signed)
Son has hand, foot,mouth virus. She reports she has not felt well since Friday AM, w chills, body aches. Denies rash, but c/i ST and "bisters" in throat. NAD

## 2014-10-12 NOTE — Discharge Instructions (Signed)

## 2014-10-13 NOTE — ED Notes (Signed)
Final report of strep culture negative 

## 2014-10-14 LAB — CULTURE, GROUP A STREP: Strep A Culture: NEGATIVE

## 2015-06-24 MED FILL — DESLORATADINE 5 MG TABLET: 5 | 30 days supply | Qty: 30 | Fill #1

## 2015-07-17 MED FILL — DESLORATADINE 5 MG TABLET: 5 | 30 days supply | Qty: 30 | Fill #2

## 2015-07-17 MED FILL — QNASL 80 MCG NASAL SPRAY: 80 | 30 days supply | Qty: 9 | Fill #0

## 2015-07-24 DIAGNOSIS — H52223 Regular astigmatism, bilateral: Secondary | ICD-10-CM | POA: Diagnosis not present

## 2015-08-25 MED FILL — QNASL 80 MCG NASAL SPRAY: 80 | 30 days supply | Qty: 9 | Fill #1

## 2015-08-25 MED FILL — DESLORATADINE 5 MG TABLET: 5 | 30 days supply | Qty: 30 | Fill #3

## 2015-10-03 MED FILL — DESLORATADINE 5 MG TABLET: 5 | 30 days supply | Qty: 30 | Fill #4

## 2015-10-03 MED FILL — QNASL 80 MCG NASAL SPRAY: 80 | 30 days supply | Qty: 9 | Fill #2

## 2015-11-17 MED FILL — DESLORATADINE 5 MG TABLET: 5 | 30 days supply | Qty: 30 | Fill #5

## 2015-11-20 MED FILL — QNASL 80 MCG NASAL SPRAY: 80 | 30 days supply | Qty: 9 | Fill #0

## 2015-12-25 DIAGNOSIS — R51 Headache: Secondary | ICD-10-CM | POA: Diagnosis not present

## 2015-12-25 DIAGNOSIS — H1045 Other chronic allergic conjunctivitis: Secondary | ICD-10-CM | POA: Diagnosis not present

## 2015-12-25 DIAGNOSIS — J301 Allergic rhinitis due to pollen: Secondary | ICD-10-CM | POA: Diagnosis not present

## 2015-12-25 DIAGNOSIS — J3089 Other allergic rhinitis: Secondary | ICD-10-CM | POA: Diagnosis not present

## 2015-12-25 MED FILL — QNASL 80 MCG NASAL SPRAY: 80 | 30 days supply | Qty: 9 | Fill #0

## 2015-12-25 MED FILL — DESLORATADINE 5 MG TABLET: 5 | 30 days supply | Qty: 30 | Fill #0

## 2016-01-22 DIAGNOSIS — N926 Irregular menstruation, unspecified: Secondary | ICD-10-CM | POA: Diagnosis not present

## 2016-01-26 MED FILL — QNASL 80 MCG NASAL SPRAY: 80 | 30 days supply | Qty: 9 | Fill #1

## 2016-01-26 MED FILL — LETROZOLE 2.5 MG TABLET: 2.5 | 30 days supply | Qty: 10 | Fill #0

## 2016-01-26 MED FILL — DESLORATADINE 5 MG TABLET: 5 | 30 days supply | Qty: 30 | Fill #1

## 2016-02-06 DIAGNOSIS — Z319 Encounter for procreative management, unspecified: Secondary | ICD-10-CM | POA: Diagnosis not present

## 2016-03-17 MED FILL — DESLORATADINE 5 MG TABLET: 5 | 30 days supply | Qty: 30 | Fill #2

## 2016-03-17 MED FILL — QNASL 80 MCG NASAL SPRAY: 80 | 30 days supply | Qty: 9 | Fill #2

## 2016-04-05 DIAGNOSIS — Z3200 Encounter for pregnancy test, result unknown: Secondary | ICD-10-CM | POA: Diagnosis not present

## 2016-04-07 DIAGNOSIS — Z3200 Encounter for pregnancy test, result unknown: Secondary | ICD-10-CM | POA: Diagnosis not present

## 2016-04-08 DIAGNOSIS — Z3201 Encounter for pregnancy test, result positive: Secondary | ICD-10-CM | POA: Diagnosis not present

## 2016-04-12 MED FILL — DICLEGIS DR 10-10 MG TABLET: 10-10 | 30 days supply | Qty: 120 | Fill #0

## 2016-04-16 DIAGNOSIS — Z3201 Encounter for pregnancy test, result positive: Secondary | ICD-10-CM | POA: Diagnosis not present

## 2016-05-05 DIAGNOSIS — Z3689 Encounter for other specified antenatal screening: Secondary | ICD-10-CM | POA: Diagnosis not present

## 2016-05-05 DIAGNOSIS — Z3481 Encounter for supervision of other normal pregnancy, first trimester: Secondary | ICD-10-CM | POA: Diagnosis not present

## 2016-05-05 DIAGNOSIS — Z113 Encounter for screening for infections with a predominantly sexual mode of transmission: Secondary | ICD-10-CM | POA: Diagnosis not present

## 2016-05-05 DIAGNOSIS — R87610 Atypical squamous cells of undetermined significance on cytologic smear of cervix (ASC-US): Secondary | ICD-10-CM | POA: Diagnosis not present

## 2016-05-05 MED FILL — ONDANSETRON HCL 4 MG TABLET: 4 | 8 days supply | Qty: 24 | Fill #0

## 2016-05-24 DIAGNOSIS — Z3481 Encounter for supervision of other normal pregnancy, first trimester: Secondary | ICD-10-CM | POA: Diagnosis not present

## 2016-05-24 DIAGNOSIS — Z3689 Encounter for other specified antenatal screening: Secondary | ICD-10-CM | POA: Diagnosis not present

## 2016-05-24 DIAGNOSIS — R5383 Other fatigue: Secondary | ICD-10-CM | POA: Diagnosis not present

## 2016-05-24 LAB — OB RESULTS CONSOLE HEPATITIS B SURFACE ANTIGEN: HEP B S AG: NEGATIVE

## 2016-05-24 LAB — OB RESULTS CONSOLE RUBELLA ANTIBODY, IGM: Rubella: IMMUNE

## 2016-05-24 LAB — OB RESULTS CONSOLE ABO/RH: RH Type: POSITIVE

## 2016-05-24 LAB — OB RESULTS CONSOLE GC/CHLAMYDIA
CHLAMYDIA, DNA PROBE: NEGATIVE
GC PROBE AMP, GENITAL: NEGATIVE

## 2016-05-24 LAB — OB RESULTS CONSOLE RPR: RPR: NONREACTIVE

## 2016-05-24 LAB — OB RESULTS CONSOLE ANTIBODY SCREEN: Antibody Screen: NEGATIVE

## 2016-05-24 LAB — OB RESULTS CONSOLE HIV ANTIBODY (ROUTINE TESTING): HIV: NONREACTIVE

## 2016-07-05 DIAGNOSIS — Z361 Encounter for antenatal screening for raised alphafetoprotein level: Secondary | ICD-10-CM | POA: Diagnosis not present

## 2016-07-05 DIAGNOSIS — Z363 Encounter for antenatal screening for malformations: Secondary | ICD-10-CM | POA: Diagnosis not present

## 2016-07-22 DIAGNOSIS — Z362 Encounter for other antenatal screening follow-up: Secondary | ICD-10-CM | POA: Diagnosis not present

## 2016-07-22 DIAGNOSIS — Z3482 Encounter for supervision of other normal pregnancy, second trimester: Secondary | ICD-10-CM | POA: Diagnosis not present

## 2016-09-15 DIAGNOSIS — Z23 Encounter for immunization: Secondary | ICD-10-CM | POA: Diagnosis not present

## 2016-09-15 DIAGNOSIS — Z3689 Encounter for other specified antenatal screening: Secondary | ICD-10-CM | POA: Diagnosis not present

## 2016-09-17 DIAGNOSIS — H52223 Regular astigmatism, bilateral: Secondary | ICD-10-CM | POA: Diagnosis not present

## 2016-10-18 DIAGNOSIS — R81 Glycosuria: Secondary | ICD-10-CM | POA: Diagnosis not present

## 2016-11-02 ENCOUNTER — Other Ambulatory Visit: Payer: Self-pay | Admitting: Obstetrics & Gynecology

## 2016-11-03 DIAGNOSIS — Z3483 Encounter for supervision of other normal pregnancy, third trimester: Secondary | ICD-10-CM | POA: Diagnosis not present

## 2016-11-03 DIAGNOSIS — O321XX0 Maternal care for breech presentation, not applicable or unspecified: Secondary | ICD-10-CM | POA: Diagnosis not present

## 2016-11-03 DIAGNOSIS — Z3A35 35 weeks gestation of pregnancy: Secondary | ICD-10-CM | POA: Diagnosis not present

## 2016-11-03 DIAGNOSIS — Z3685 Encounter for antenatal screening for Streptococcus B: Secondary | ICD-10-CM | POA: Diagnosis not present

## 2016-11-03 DIAGNOSIS — O3663X0 Maternal care for excessive fetal growth, third trimester, not applicable or unspecified: Secondary | ICD-10-CM | POA: Diagnosis not present

## 2016-11-08 LAB — OB RESULTS CONSOLE GBS: GBS: POSITIVE

## 2016-11-16 ENCOUNTER — Encounter (HOSPITAL_COMMUNITY): Payer: Self-pay

## 2016-11-17 ENCOUNTER — Encounter (HOSPITAL_COMMUNITY): Payer: Self-pay

## 2016-11-25 ENCOUNTER — Encounter (HOSPITAL_COMMUNITY): Admission: RE | Admit: 2016-11-25 | Payer: 59 | Source: Ambulatory Visit

## 2016-11-26 ENCOUNTER — Encounter (HOSPITAL_COMMUNITY)
Admission: RE | Admit: 2016-11-26 | Discharge: 2016-11-26 | Disposition: A | Discharge status: Home / Self Care | Payer: 59 | Source: Ambulatory Visit | Attending: Obstetrics & Gynecology | Admitting: Obstetrics & Gynecology

## 2016-11-26 ENCOUNTER — Encounter (HOSPITAL_COMMUNITY): Admission: RE | Admit: 2016-11-26 | Payer: 59 | Source: Ambulatory Visit

## 2016-11-26 DIAGNOSIS — O99824 Streptococcus B carrier state complicating childbirth: Secondary | ICD-10-CM | POA: Diagnosis not present

## 2016-11-26 DIAGNOSIS — O321XX0 Maternal care for breech presentation, not applicable or unspecified: Secondary | ICD-10-CM | POA: Diagnosis not present

## 2016-11-26 DIAGNOSIS — O34211 Maternal care for low transverse scar from previous cesarean delivery: Secondary | ICD-10-CM | POA: Diagnosis not present

## 2016-11-26 DIAGNOSIS — D6959 Other secondary thrombocytopenia: Secondary | ICD-10-CM | POA: Diagnosis not present

## 2016-11-26 DIAGNOSIS — Z3A39 39 weeks gestation of pregnancy: Secondary | ICD-10-CM | POA: Diagnosis not present

## 2016-11-26 DIAGNOSIS — O9912 Other diseases of the blood and blood-forming organs and certain disorders involving the immune mechanism complicating childbirth: Secondary | ICD-10-CM | POA: Diagnosis not present

## 2016-11-26 HISTORY — DX: Personal history of other infectious and parasitic diseases: Z86.19

## 2016-11-26 LAB — TYPE AND SCREEN
ABO/RH(D): A POS
Antibody Screen: NEGATIVE

## 2016-11-26 LAB — CBC
HEMATOCRIT: 36.7 % (ref 36.0–46.0)
Hemoglobin: 12.6 g/dL (ref 12.0–15.0)
MCH: 32.2 pg (ref 26.0–34.0)
MCHC: 34.3 g/dL (ref 30.0–36.0)
MCV: 93.9 fL (ref 78.0–100.0)
Platelets: 120 10*3/uL — ABNORMAL LOW (ref 150–400)
RBC: 3.91 MIL/uL (ref 3.87–5.11)
RDW: 14.3 % (ref 11.5–15.5)
WBC: 7.8 10*3/uL (ref 4.0–10.5)

## 2016-11-26 NOTE — Pre-Procedure Instructions (Signed)
Dr Arby BarretteHatchett notified of platelet count no orders received

## 2016-11-26 NOTE — Patient Instructions (Signed)
20 Tonya Rice  11/26/2016   Your procedure is scheduled on:  11/27/2016  [facility] 0745 AM.  Pick up the phone at the desk and dial [phone].   Call this number if you have problems the morning of surgery: [phone]   Remember:   Do not eat food:After Midnight.  Do not drink clear liquids: After Midnight.  Take these medicines the morning of surgery with A SIP OF WATER: none   Do not wear jewelry, make-up or nail polish.  Do not wear lotions, powders, or perfumes. Do not wear deodorant.  Do not shave 48 hours prior to surgery.  Do not bring valuables to the hospital.  [facility] is not   responsible for any belongings or valuables brought to the hospital.  Contacts, dentures or bridgework may not be worn into surgery.  Leave suitcase in the car. After surgery it may be brought to your room.  For patients admitted to the hospital, checkout time is 11:00 AM the day of              discharge.   Patients discharged the day of surgery will not be allowed to drive             home.  Name and phone number of your driver: na  Special Instructions:   N/A   Please read over the following fact sheets that you were given:   Surgical Site Infection Prevention

## 2016-11-27 ENCOUNTER — Encounter (HOSPITAL_COMMUNITY): Payer: Self-pay | Admitting: *Deleted

## 2016-11-27 ENCOUNTER — Inpatient Hospital Stay (HOSPITAL_COMMUNITY)
Admission: RE | Admit: 2016-11-27 | Discharge: 2016-11-29 | DRG: 765 | Disposition: A | Discharge status: Home / Self Care | Payer: 59 | Source: Ambulatory Visit | Attending: Obstetrics & Gynecology | Admitting: Obstetrics & Gynecology

## 2016-11-27 ENCOUNTER — Inpatient Hospital Stay (HOSPITAL_COMMUNITY): Payer: 59 | Admitting: Certified Registered Nurse Anesthetist

## 2016-11-27 ENCOUNTER — Encounter (HOSPITAL_COMMUNITY)
Admission: RE | Disposition: A | Discharge status: Home / Self Care | Payer: Self-pay | Source: Ambulatory Visit | Attending: Obstetrics & Gynecology

## 2016-11-27 DIAGNOSIS — O321XX0 Maternal care for breech presentation, not applicable or unspecified: Secondary | ICD-10-CM | POA: Diagnosis not present

## 2016-11-27 DIAGNOSIS — O34211 Maternal care for low transverse scar from previous cesarean delivery: Principal | ICD-10-CM | POA: Diagnosis present

## 2016-11-27 DIAGNOSIS — Z3A39 39 weeks gestation of pregnancy: Secondary | ICD-10-CM | POA: Diagnosis not present

## 2016-11-27 DIAGNOSIS — D6959 Other secondary thrombocytopenia: Secondary | ICD-10-CM | POA: Diagnosis present

## 2016-11-27 DIAGNOSIS — Z3A00 Weeks of gestation of pregnancy not specified: Secondary | ICD-10-CM | POA: Diagnosis not present

## 2016-11-27 DIAGNOSIS — O34219 Maternal care for unspecified type scar from previous cesarean delivery: Secondary | ICD-10-CM | POA: Diagnosis present

## 2016-11-27 DIAGNOSIS — O9912 Other diseases of the blood and blood-forming organs and certain disorders involving the immune mechanism complicating childbirth: Secondary | ICD-10-CM | POA: Diagnosis present

## 2016-11-27 DIAGNOSIS — O329XX0 Maternal care for malpresentation of fetus, unspecified, not applicable or unspecified: Secondary | ICD-10-CM | POA: Diagnosis present

## 2016-11-27 DIAGNOSIS — O99824 Streptococcus B carrier state complicating childbirth: Secondary | ICD-10-CM | POA: Diagnosis present

## 2016-11-27 DIAGNOSIS — D696 Thrombocytopenia, unspecified: Secondary | ICD-10-CM | POA: Diagnosis present

## 2016-11-27 DIAGNOSIS — O99119 Other diseases of the blood and blood-forming organs and certain disorders involving the immune mechanism complicating pregnancy, unspecified trimester: Secondary | ICD-10-CM | POA: Diagnosis present

## 2016-11-27 DIAGNOSIS — O321XX1 Maternal care for breech presentation, fetus 1: Secondary | ICD-10-CM | POA: Diagnosis not present

## 2016-11-27 LAB — RPR: RPR Ser Ql: NONREACTIVE

## 2016-11-27 SURGERY — Surgical Case
Anesthesia: Spinal

## 2016-11-27 MED ORDER — NALBUPHINE HCL 10 MG/ML IJ SOLN: 5.0000 mg | INTRAMUSCULAR | Status: DC | PRN

## 2016-11-27 MED ORDER — LACTATED RINGERS IV SOLN
INTRAVENOUS | Status: DC | PRN
  Administered 2016-11-27: 40 [IU] via INTRAVENOUS

## 2016-11-27 MED ORDER — ERYTHROMYCIN 5 MG/GM OP OINT
TOPICAL_OINTMENT | OPHTHALMIC | Status: AC
  Filled 2016-11-27: qty 1

## 2016-11-27 MED ORDER — SENNOSIDES-DOCUSATE SODIUM 8.6-50 MG PO TABS
2.0000 | ORAL_TABLET | ORAL | Status: DC
  Administered 2016-11-28: 2 via ORAL
  Filled 2016-11-27 (×2): qty 2

## 2016-11-27 MED ORDER — SCOPOLAMINE 1 MG/3DAYS TD PT72
MEDICATED_PATCH | TRANSDERMAL | Status: AC
  Filled 2016-11-27: qty 1

## 2016-11-27 MED ORDER — MORPHINE SULFATE (PF) 0.5 MG/ML IJ SOLN
INTRAMUSCULAR | Status: AC
  Filled 2016-11-27: qty 10

## 2016-11-27 MED ORDER — ACETAMINOPHEN 325 MG PO TABS
650.0000 mg | ORAL_TABLET | ORAL | Status: DC | PRN
  Administered 2016-11-27 – 2016-11-29 (×2): 650 mg via ORAL
  Filled 2016-11-27 (×2): qty 2

## 2016-11-27 MED ORDER — IBUPROFEN 600 MG PO TABS
600.0000 mg | ORAL_TABLET | Freq: Four times a day (QID) | ORAL | Status: DC
  Administered 2016-11-28 – 2016-11-29 (×7): 600 mg via ORAL
  Filled 2016-11-27 (×7): qty 1

## 2016-11-27 MED ORDER — ONDANSETRON HCL 4 MG/2ML IJ SOLN: 4.0000 mg | Freq: Three times a day (TID) | INTRAMUSCULAR | Status: DC | PRN

## 2016-11-27 MED ORDER — SODIUM CHLORIDE 0.9% FLUSH: 3.0000 mL | INTRAVENOUS | Status: DC | PRN

## 2016-11-27 MED ORDER — KETOROLAC TROMETHAMINE 30 MG/ML IJ SOLN
30.0000 mg | Freq: Four times a day (QID) | INTRAMUSCULAR | Status: DC | PRN
  Administered 2016-11-27: 30 mg via INTRAMUSCULAR

## 2016-11-27 MED ORDER — WITCH HAZEL-GLYCERIN EX PADS: 1.0000 "application " | MEDICATED_PAD | CUTANEOUS | Status: DC | PRN

## 2016-11-27 MED ORDER — OXYCODONE-ACETAMINOPHEN 5-325 MG PO TABS: 2.0000 | ORAL_TABLET | ORAL | Status: DC | PRN

## 2016-11-27 MED ORDER — COCONUT OIL OIL: 1.0000 "application " | TOPICAL_OIL | Status: DC | PRN

## 2016-11-27 MED ORDER — BUPIVACAINE IN DEXTROSE 0.75-8.25 % IT SOLN
INTRATHECAL | Status: AC
  Filled 2016-11-27: qty 4

## 2016-11-27 MED ORDER — HYDROMORPHONE HCL 1 MG/ML IJ SOLN: 0.2500 mg | INTRAMUSCULAR | Status: DC | PRN

## 2016-11-27 MED ORDER — SIMETHICONE 80 MG PO CHEW: 80.0000 mg | CHEWABLE_TABLET | ORAL | Status: DC | PRN

## 2016-11-27 MED ORDER — PROMETHAZINE HCL 25 MG/ML IJ SOLN: 6.2500 mg | INTRAMUSCULAR | Status: DC | PRN

## 2016-11-27 MED ORDER — SCOPOLAMINE 1 MG/3DAYS TD PT72
1.0000 | MEDICATED_PATCH | Freq: Once | TRANSDERMAL | Status: DC
  Filled 2016-11-27: qty 1

## 2016-11-27 MED ORDER — NALOXONE HCL 2 MG/2ML IJ SOSY
1.0000 ug/kg/h | PREFILLED_SYRINGE | INTRAVENOUS | Status: DC | PRN
  Filled 2016-11-27: qty 2

## 2016-11-27 MED ORDER — MEPERIDINE HCL 25 MG/ML IJ SOLN: 6.2500 mg | INTRAMUSCULAR | Status: DC | PRN

## 2016-11-27 MED ORDER — IBUPROFEN 600 MG PO TABS: 600.0000 mg | ORAL_TABLET | Freq: Four times a day (QID) | ORAL | Status: DC | PRN

## 2016-11-27 MED ORDER — PRENATAL MULTIVITAMIN CH
1.0000 | ORAL_TABLET | Freq: Every day | ORAL | Status: DC
  Administered 2016-11-28 – 2016-11-29 (×2): 1 via ORAL
  Filled 2016-11-27 (×2): qty 1

## 2016-11-27 MED ORDER — FENTANYL CITRATE (PF) 100 MCG/2ML IJ SOLN
INTRAMUSCULAR | Status: DC | PRN
  Administered 2016-11-27: 80 ug via INTRAVENOUS
  Administered 2016-11-27: 20 ug via INTRATHECAL

## 2016-11-27 MED ORDER — MENTHOL 3 MG MT LOZG: 1.0000 | LOZENGE | OROMUCOSAL | Status: DC | PRN

## 2016-11-27 MED ORDER — SODIUM CHLORIDE 0.9 % IR SOLN
Status: DC | PRN
  Administered 2016-11-27: 1

## 2016-11-27 MED ORDER — LACTATED RINGERS IV SOLN
INTRAVENOUS | Status: DC | PRN
  Administered 2016-11-27: 12:00:00 via INTRAVENOUS

## 2016-11-27 MED ORDER — NALBUPHINE HCL 10 MG/ML IJ SOLN: 5.0000 mg | Freq: Once | INTRAMUSCULAR | Status: DC | PRN

## 2016-11-27 MED ORDER — DIPHENHYDRAMINE HCL 25 MG PO CAPS: 25.0000 mg | ORAL_CAPSULE | ORAL | Status: DC | PRN

## 2016-11-27 MED ORDER — PHENYLEPHRINE 8 MG IN D5W 100 ML (0.08MG/ML) PREMIX OPTIME
INJECTION | INTRAVENOUS | Status: DC | PRN
  Administered 2016-11-27: 60 ug/min via INTRAVENOUS

## 2016-11-27 MED ORDER — ACETAMINOPHEN 500 MG PO TABS: 1000.0000 mg | ORAL_TABLET | Freq: Four times a day (QID) | ORAL | Status: DC

## 2016-11-27 MED ORDER — SIMETHICONE 80 MG PO CHEW
80.0000 mg | CHEWABLE_TABLET | ORAL | Status: DC
  Administered 2016-11-28 (×2): 80 mg via ORAL
  Filled 2016-11-27 (×2): qty 1

## 2016-11-27 MED ORDER — FENTANYL CITRATE (PF) 100 MCG/2ML IJ SOLN
INTRAMUSCULAR | Status: AC
  Filled 2016-11-27: qty 2

## 2016-11-27 MED ORDER — DIPHENHYDRAMINE HCL 25 MG PO CAPS
25.0000 mg | ORAL_CAPSULE | Freq: Four times a day (QID) | ORAL | Status: DC | PRN
  Administered 2016-11-27: 25 mg via ORAL
  Filled 2016-11-27: qty 1

## 2016-11-27 MED ORDER — DIPHENHYDRAMINE HCL 50 MG/ML IJ SOLN: 12.5000 mg | INTRAMUSCULAR | Status: DC | PRN

## 2016-11-27 MED ORDER — PHENYLEPHRINE 8 MG IN D5W 100 ML (0.08MG/ML) PREMIX OPTIME
INJECTION | INTRAVENOUS | Status: AC
  Filled 2016-11-27: qty 100

## 2016-11-27 MED ORDER — SCOPOLAMINE 1 MG/3DAYS TD PT72
MEDICATED_PATCH | TRANSDERMAL | Status: DC | PRN
  Administered 2016-11-27: 1 via TRANSDERMAL

## 2016-11-27 MED ORDER — ONDANSETRON HCL 4 MG/2ML IJ SOLN
INTRAMUSCULAR | Status: AC
  Filled 2016-11-27: qty 2

## 2016-11-27 MED ORDER — KETOROLAC TROMETHAMINE 30 MG/ML IJ SOLN: 30.0000 mg | Freq: Four times a day (QID) | INTRAMUSCULAR | Status: DC | PRN

## 2016-11-27 MED ORDER — MORPHINE SULFATE (PF) 0.5 MG/ML IJ SOLN
INTRAMUSCULAR | Status: DC | PRN
  Administered 2016-11-27: .2 mg via INTRATHECAL

## 2016-11-27 MED ORDER — LACTATED RINGERS IV SOLN
INTRAVENOUS | Status: DC
  Administered 2016-11-27: 16:00:00 via INTRAVENOUS

## 2016-11-27 MED ORDER — DIBUCAINE 1 % RE OINT: 1.0000 "application " | TOPICAL_OINTMENT | RECTAL | Status: DC | PRN

## 2016-11-27 MED ORDER — LACTATED RINGERS IV SOLN
INTRAVENOUS | Status: DC
  Administered 2016-11-27 (×2): via INTRAVENOUS

## 2016-11-27 MED ORDER — NALOXONE HCL 0.4 MG/ML IJ SOLN: 0.4000 mg | INTRAMUSCULAR | Status: DC | PRN

## 2016-11-27 MED ORDER — ZOLPIDEM TARTRATE 5 MG PO TABS: 5.0000 mg | ORAL_TABLET | Freq: Every evening | ORAL | Status: DC | PRN

## 2016-11-27 MED ORDER — SIMETHICONE 80 MG PO CHEW
80.0000 mg | CHEWABLE_TABLET | Freq: Three times a day (TID) | ORAL | Status: DC
  Administered 2016-11-28 – 2016-11-29 (×4): 80 mg via ORAL
  Filled 2016-11-27 (×4): qty 1

## 2016-11-27 MED ORDER — TETANUS-DIPHTH-ACELL PERTUSSIS 5-2.5-18.5 LF-MCG/0.5 IM SUSP: 0.5000 mL | Freq: Once | INTRAMUSCULAR | Status: DC

## 2016-11-27 MED ORDER — KETOROLAC TROMETHAMINE 30 MG/ML IJ SOLN: 30.0000 mg | Freq: Once | INTRAMUSCULAR | Status: DC | PRN

## 2016-11-27 MED ORDER — OXYTOCIN 10 UNIT/ML IJ SOLN
INTRAMUSCULAR | Status: AC
  Filled 2016-11-27: qty 4

## 2016-11-27 MED ORDER — CEFAZOLIN SODIUM-DEXTROSE 2-4 GM/100ML-% IV SOLN
2.0000 g | INTRAVENOUS | Status: AC
  Administered 2016-11-27: 2 g via INTRAVENOUS
  Filled 2016-11-27: qty 100

## 2016-11-27 MED ORDER — OXYCODONE-ACETAMINOPHEN 5-325 MG PO TABS: 1.0000 | ORAL_TABLET | ORAL | Status: DC | PRN

## 2016-11-27 MED ORDER — KETOROLAC TROMETHAMINE 30 MG/ML IJ SOLN
INTRAMUSCULAR | Status: AC
  Filled 2016-11-27: qty 1

## 2016-11-27 MED ORDER — ONDANSETRON HCL 4 MG/2ML IJ SOLN
INTRAMUSCULAR | Status: DC | PRN
  Administered 2016-11-27: 4 mg via INTRAVENOUS

## 2016-11-27 MED ORDER — VITAMIN K1 1 MG/0.5ML IJ SOLN
INTRAMUSCULAR | Status: AC
  Filled 2016-11-27: qty 0.5

## 2016-11-27 MED ORDER — BUPIVACAINE IN DEXTROSE 0.75-8.25 % IT SOLN
INTRATHECAL | Status: DC | PRN
  Administered 2016-11-27: 1.4 mL via INTRATHECAL

## 2016-11-27 MED ORDER — OXYTOCIN 40 UNITS IN LACTATED RINGERS INFUSION - SIMPLE MED: 2.5000 [IU]/h | INTRAVENOUS | Status: AC

## 2016-11-27 SURGICAL SUPPLY — 38 items
BENZOIN TINCTURE PRP APPL 2/3 (GAUZE/BANDAGES/DRESSINGS) ×3 IMPLANT
CHLORAPREP W/TINT 26ML (MISCELLANEOUS) ×3 IMPLANT
CLAMP CORD UMBIL (MISCELLANEOUS) IMPLANT
CLOSURE STERI-STRIP 1/4X4 (GAUZE/BANDAGES/DRESSINGS) ×3 IMPLANT
CLOSURE WOUND 1/2 X4 (GAUZE/BANDAGES/DRESSINGS)
CLOTH BEACON ORANGE TIMEOUT ST (SAFETY) ×3 IMPLANT
CONTAINER PREFILL 10% NBF 15ML (MISCELLANEOUS) IMPLANT
DRSG OPSITE POSTOP 4X10 (GAUZE/BANDAGES/DRESSINGS) ×3 IMPLANT
ELECT REM PT RETURN 9FT ADLT (ELECTROSURGICAL) ×3
ELECTRODE REM PT RTRN 9FT ADLT (ELECTROSURGICAL) ×1 IMPLANT
EXTRACTOR VACUUM KIWI (MISCELLANEOUS) IMPLANT
EXTRACTOR VACUUM M CUP 4 TUBE (SUCTIONS) IMPLANT
EXTRACTOR VACUUM M CUP 4' TUBE (SUCTIONS)
GLOVE BIO SURGEON STRL SZ7 (GLOVE) ×3 IMPLANT
GLOVE BIOGEL PI IND STRL 7.0 (GLOVE) ×2 IMPLANT
GLOVE BIOGEL PI INDICATOR 7.0 (GLOVE) ×4
GOWN STRL REUS W/TWL LRG LVL3 (GOWN DISPOSABLE) ×6 IMPLANT
KIT ABG SYR 3ML LUER SLIP (SYRINGE) IMPLANT
NEEDLE HYPO 25X5/8 SAFETYGLIDE (NEEDLE) IMPLANT
NS IRRIG 1000ML POUR BTL (IV SOLUTION) ×3 IMPLANT
PACK C SECTION WH (CUSTOM PROCEDURE TRAY) ×3 IMPLANT
PAD ABD 7.5X8 STRL (GAUZE/BANDAGES/DRESSINGS) ×6 IMPLANT
PAD ABD 8X7 1/2 STERILE (GAUZE/BANDAGES/DRESSINGS) ×6 IMPLANT
PAD OB MATERNITY 4.3X12.25 (PERSONAL CARE ITEMS) ×3 IMPLANT
RTRCTR C-SECT PINK 25CM LRG (MISCELLANEOUS) IMPLANT
STRIP CLOSURE SKIN 1/2X4 (GAUZE/BANDAGES/DRESSINGS) IMPLANT
SUT MON AB-0 CT1 36 (SUTURE) ×6 IMPLANT
SUT PLAIN 0 NONE (SUTURE) IMPLANT
SUT PLAIN 2 0 (SUTURE)
SUT PLAIN ABS 2-0 CT1 27XMFL (SUTURE) IMPLANT
SUT VIC AB 0 CT1 27 (SUTURE) ×4
SUT VIC AB 0 CT1 27XBRD ANBCTR (SUTURE) ×2 IMPLANT
SUT VIC AB 2-0 CT1 27 (SUTURE) ×2
SUT VIC AB 2-0 CT1 TAPERPNT 27 (SUTURE) ×1 IMPLANT
SUT VIC AB 4-0 KS 27 (SUTURE) ×3 IMPLANT
SUT VICRYL 0 TIES 12 18 (SUTURE) IMPLANT
TOWEL OR 17X24 6PK STRL BLUE (TOWEL DISPOSABLE) ×3 IMPLANT
TRAY FOLEY BAG SILVER LF 14FR (SET/KITS/TRAYS/PACK) IMPLANT

## 2016-11-27 NOTE — Anesthesia Procedure Notes (Signed)
Spinal  Patient location during procedure: OR Start time: 11/27/2016 12:00 PM End time: 11/27/2016 12:06 PM Staffing Anesthesiologist: Leilani AbleHATCHETT, Amaia Lavallie Performed: anesthesiologist  Preanesthetic Checklist Completed: patient identified, surgical consent, pre-op evaluation, timeout performed, IV checked, risks and benefits discussed and monitors and equipment checked Spinal Block Patient position: sitting Prep: site prepped and draped and DuraPrep Patient monitoring: heart rate, cardiac monitor, continuous pulse ox and blood pressure Approach: midline Location: L3-4 Needle Needle type: Pencan  Needle gauge: 24 G Needle length: 10 cm Needle insertion depth: 4 cm Assessment Sensory level: T4

## 2016-11-27 NOTE — Anesthesia Preprocedure Evaluation (Signed)
Anesthesia Evaluation  Patient identified by MRN, date of birth, ID band Patient awake    Reviewed: Allergy & Precautions, H&P , NPO status , Patient's Chart, lab work & pertinent test results  Airway Mallampati: I  TM Distance: >3 FB Neck ROM: full    Dental no notable dental hx. (+) Teeth Intact   Pulmonary neg pulmonary ROS,    Pulmonary exam normal breath sounds clear to auscultation       Cardiovascular negative cardio ROS Normal cardiovascular exam Rhythm:regular Rate:Normal     Neuro/Psych negative neurological ROS  negative psych ROS   GI/Hepatic negative GI ROS, Neg liver ROS,   Endo/Other  negative endocrine ROS  Renal/GU negative Renal ROS  negative genitourinary   Musculoskeletal negative musculoskeletal ROS (+)   Abdominal Normal abdominal exam  (+)   Peds  Hematology negative hematology ROS (+)   Anesthesia Other Findings   Reproductive/Obstetrics (+) Pregnancy                             Anesthesia Physical Anesthesia Plan  ASA: II  Anesthesia Plan: Spinal   Post-op Pain Management:    Induction:   PONV Risk Score and Plan: 3 and Ondansetron, Dexamethasone, Propofol, Midazolam and Treatment may vary due to age or medical condition  Airway Management Planned: Natural Airway, Simple Face Mask and Nasal Cannula  Additional Equipment:   Intra-op Plan:   Post-operative Plan:   Informed Consent: I have reviewed the patients History and Physical, chart, labs and discussed the procedure including the risks, benefits and alternatives for the proposed anesthesia with the patient or authorized representative who has indicated his/her understanding and acceptance.     Plan Discussed with: CRNA and Surgeon  Anesthesia Plan Comments:         Anesthesia Quick Evaluation

## 2016-11-27 NOTE — Op Note (Signed)
Cesarean Section Procedure Note   Derisha E Wisecup  11/27/2016  Indications: Breech Presentation  39 wks, prior C-section  Pre-operative Diagnosis: Previous Cesarean Section, breech.   Post-operative Diagnosis: Same   Surgeon: Shea EvansMody, Blaire Palomino, MD - Primary   Assistants: Arlan Organaniela Paul, CNM   Anesthesia: spinal   Procedure Details:  The patient was seen in the Holding Room. The risks, benefits, complications, treatment options, and expected outcomes were discussed with the patient. The patient concurred with the proposed plan, giving informed consent. identified as Wahneta E Onder and the procedure verified as C-Section Delivery. A Time Out was held and the above information confirmed.  After induction of anesthesia, the patient was draped and prepped in the usual sterile manner. 2 gm Ancef given. A Pfannenstiel incision was made and carried down through the subcutaneous tissue to the fascia. Fascial incision was made and extended transversely. The fascia was separated from the underlying rectus tissue superiorly and inferiorly. The peritoneum was identified and entered. Peritoneal incision was extended longitudinally. Alexis retractor placed. The utero-vesical peritoneal reflection was incised transversely and the bladder flap was bluntly freed from the lower uterine segment. A low transverse uterine incision was made. Copious clear amniotic fluid drained. Baby was in complete breech presentation. Baby BOY was delivered at 12.28 pm on 11/27/16 by complete breech extraction without difficulty.  Delayed cord clamping done and baby handed to NICU team,  Apgar scores of 10 at one minute and 10 at five minutes. Cord ph was not sent. Cord blood was obtained for evaluation. The placenta was removed manually but Intact and appeared normal. The uterine outline, tubes and ovaries appeared normal. The uterine incision was closed with running locked sutures of 0Monocryl followed by a second imbricating layer  with same suture. Hemostasis was observed. Retractor removed. Peritoneal closure done with 2-0 Vicryl.  The fascia was then reapproximated with running sutures of 0Vicryl. The subcuticular closure was performed using 2-0plain gut. The skin was closed with 4-0Vicryl. Dressings placed, pressure dressing placed.   Instrument, sponge, and needle counts were correct prior the abdominal closure and were correct at the conclusion of the case.   Findings: BOY, delivered from complete breech via breech extraction, at 12.28 pm and Apgars 10 and 10. Weight pending. Normal placenta, cord, tubes and ovaries.    Estimated Blood Loss: 700 cc   Total IV Fluids: 2200 ml LR   Urine Output: 100 CC OF clear urine  Specimens: cord blood  Complications: no complications  Disposition: PACU - hemodynamically stable.   Maternal Condition: stable   Baby condition / location:  Couplet care / Skin to Skin  Attending Attestation: I performed the procedure.   Signed: Surgeon(s): Shea EvansMody, Colen Eltzroth, MD

## 2016-11-27 NOTE — Progress Notes (Signed)
9.45 am Scheduled RC/s delayed due to emergencies in OR per L&D staff. Awaiting turn.  V.Magaret Justo, MD

## 2016-11-27 NOTE — Plan of Care (Signed)
Problem: Safety: Goal: Ability to remain free from injury will improve Outcome: Completed/Met Date Met: 11/27/16 Instructed patient to not get out of bed alone until nursing staff states that it is ok.   Problem: Education: Goal: Knowledge of condition will improve Admission education, safety and unit protocols reviewed with patient and significant other. Patient verbalizes understanding.

## 2016-11-27 NOTE — H&P (Signed)
Tonya Rice is a 31 y.o. female presenting for repeat C/section for Breech presentation at 39 wks. Prior c/section was arrest of labor in 2nd stage.  Uncomplicated pregnancy. GBS(+)  OB History    Gravida Para Term Preterm AB Living   2 1 1          SAB TAB Ectopic Multiple Live Births                 Past Medical History:  Diagnosis Date  . Hx of varicella   . Medical history non-contributory    Past Surgical History:  Procedure Laterality Date  . CESAREAN SECTION N/A 08/20/2012   Procedure: CESAREAN SECTION;  Surgeon: Robley FriesVaishali R Roya Gieselman, MD;  Location: [facility];  Service: Obstetrics;  Laterality: N/A;  . WISDOM TOOTH EXTRACTION     Family History: family history includes COPD in her maternal grandfather; Cervical cancer in her maternal aunt; Congenital heart disease in her maternal grandfather; Lung cancer in her maternal grandmother; Stroke in her maternal grandmother. Social History:  reports that she has never smoked. She has never used smokeless tobacco. She reports that she does not drink alcohol or use drugs.     Maternal Diabetes: No Genetic Screening: Declined Maternal Ultrasounds/Referrals: Normal Fetal Ultrasounds or other Referrals:  None Maternal Substance Abuse:  No Significant Maternal Medications:  None Significant Maternal Lab Results:  Lab values include: Group B Strep positive Other Comments:  None  ROS  neg History   unknown if currently breastfeeding. Exam Physical Exam  BP 107/70 (BP Location: Left Arm)   Temp 98.8 F (37.1 C) (Oral)   Resp 18   Physical exam:  A&O x 3, no acute distress. Pleasant HEENT neg, no thyromegaly Lungs CTA bilat CV RRR, S1S2 normal Abdo soft, non tender, non acute Extr no edema/ tenderness Pelvic def FHT  140s Toco none  Prenatal labs: ABO, Rh: --/--/A POS (06/22 1145) Antibody: NEG (06/22 1145) Rubella: Immune (12/18 0000) RPR: Non Reactive (06/22 1145)  HBsAg: Negative (12/18 0000)  HIV: Non-reactive  (12/18 0000)  GBS: Positive (06/04 0000)   Assessment/Plan: G2P1001. 39 wks, prior C/section, now breech. Here for repeat C-section Risks/complications of surgery reviewed incl infection, bleeding, damage to internal organs including bladder, bowels, ureters, blood vessels, other risks from anesthesia, VTE and delayed complications of any surgery, complications in future surgery reviewed. Also discussed neonatal complications incl difficult delivery, laceration, vacuum assistance, TTN etc. Pt understands and agrees, all concerns addressed.    Case delayed due other emergencies in the OR  Raisa Ditto R 11/27/2016, 7:57 AM

## 2016-11-27 NOTE — Addendum Note (Signed)
Addendum  created 11/27/16 1525 by Shanon PayorGregory, Charina Fons M, CRNA   Anesthesia Intra Meds edited

## 2016-11-27 NOTE — Transfer of Care (Signed)
Immediate Anesthesia Transfer of Care Note  Patient: Tonya Rice  Procedure(s) Performed: Procedure(s) with comments: Repeat CESAREAN SECTION (N/A) - EDD: 12/04/16  Patient Location: PACU  Anesthesia Type:Spinal  Level of Consciousness: awake, alert , oriented and patient cooperative  Airway & Oxygen Therapy: Patient Spontanous Breathing  Post-op Assessment: Report given to RN and Post -op Vital signs reviewed and stable  Post vital signs: Reviewed and stable  Last Vitals:  Vitals:   11/27/16 0803  BP: 107/70  Resp: 18  Temp: 37.1 C    Last Pain:  Vitals:   11/27/16 0803  TempSrc: Oral         Complications: No apparent anesthesia complications

## 2016-11-27 NOTE — Anesthesia Postprocedure Evaluation (Signed)
Anesthesia Post Note  Patient: Tonya Rice  Procedure(s) Performed: Procedure(s) (LRB): Repeat CESAREAN SECTION (N/A)     Patient location during evaluation: PACU Anesthesia Type: Spinal Level of consciousness: awake Pain management: pain level controlled Vital Signs Assessment: post-procedure vital signs reviewed and stable Respiratory status: spontaneous breathing Cardiovascular status: stable Postop Assessment: no headache, no backache, spinal receding, patient able to bend at knees and no signs of nausea or vomiting Anesthetic complications: no    Last Vitals:  Vitals:   11/27/16 1345 11/27/16 1400  BP: 115/69 102/67  Pulse: 65 (!) 55  Resp: (!) 21 17  Temp: 36.4 C     Last Pain:  Vitals:   11/27/16 1345  TempSrc: Oral   Pain Goal:                 Kingson Lohmeyer JR,JOHN Tyianna Menefee

## 2016-11-27 NOTE — Lactation Note (Signed)
This note was copied from a baby's chart. Lactation Consultation Note  Patient Name: Tonya Hortense RamalCybill Bajaj ZOXWR'UToday's Date: 11/27/2016 Reason for consult: Initial assessment   Initial consult with Exp BF mom of 11 hour old infant. Infant was latched and feeding when LC entered room in the cradle hold. Mom reports pinching at times, infant noted to be shallow with lower lip curled in, enc mom to wait of wide open mouth to latch infant and to flange lips as needed. Mom was aware when latch was shallow and would relatch infant.   Mom with soft compressible breasts and areola with everted nipples. She reports she BF her 31 yo for 14 months and had a good supply of milk. She reports she is aware of how to hand express.   Enc mom to feed infant STS 8-12 x in 24 hours. Enc mom to use bot breasts with each feeding, pillow and head support and breast massage with feeding. Enc mom to call out for feeding assistance as needed.   BF resource Handout and LC Brochure given, mom informed of IP/OP Services, BF Support Groups and LC phone #. Mom is a [employer], [employer] Employee and plans to get a Medela pump at d/c. She has no questions/concerns at this time.    Maternal Data Formula Feeding for Exclusion: No Has patient been taught Hand Expression?: Yes Does the patient have breastfeeding experience prior to this delivery?: Yes  Feeding Feeding Type: Breast Fed  LATCH Score/Interventions Latch: Repeated attempts needed to sustain latch, nipple held in mouth throughout feeding, stimulation needed to elicit sucking reflex. Intervention(s): Adjust position;Assist with latch;Breast massage;Breast compression  Audible Swallowing: A few with stimulation Intervention(s): Hand expression;Alternate breast massage;Skin to skin  Type of Nipple: Everted at rest and after stimulation  Comfort (Breast/Nipple): Filling, red/small blisters or bruises, mild/mod discomfort  Problem noted: Mild/Moderate discomfort Interventions   (Cracked/bleeding/bruising/blister): Expressed breast milk to nipple  Hold (Positioning): Assistance needed to correctly position infant at breast and maintain latch. Intervention(s): Support Pillows;Position options;Skin to skin;Breastfeeding basics reviewed  LATCH Score: 6  Lactation Tools Discussed/Used WIC Program: No   Consult Status Consult Status: Follow-up Date: 11/28/16 Follow-up type: In-patient    Silas FloodSharon S Cylah Fannin 11/27/2016, 11:59 PM

## 2016-11-28 DIAGNOSIS — D696 Thrombocytopenia, unspecified: Secondary | ICD-10-CM | POA: Diagnosis present

## 2016-11-28 DIAGNOSIS — O99119 Other diseases of the blood and blood-forming organs and certain disorders involving the immune mechanism complicating pregnancy, unspecified trimester: Secondary | ICD-10-CM

## 2016-11-28 LAB — CBC
HEMATOCRIT: 31.6 % — AB (ref 36.0–46.0)
Hemoglobin: 11 g/dL — ABNORMAL LOW (ref 12.0–15.0)
MCH: 32.6 pg (ref 26.0–34.0)
MCHC: 34.8 g/dL (ref 30.0–36.0)
MCV: 93.8 fL (ref 78.0–100.0)
Platelets: 116 10*3/uL — ABNORMAL LOW (ref 150–400)
RBC: 3.37 MIL/uL — AB (ref 3.87–5.11)
RDW: 14.3 % (ref 11.5–15.5)
WBC: 10.1 10*3/uL (ref 4.0–10.5)

## 2016-11-28 MED ORDER — COCONUT OIL OIL: 1.0000 "application " | TOPICAL_OIL | 0 refills | Status: AC | PRN

## 2016-11-28 MED ORDER — OXYCODONE-ACETAMINOPHEN 5-325 MG PO TABS: 1.0000 | ORAL_TABLET | ORAL | 0 refills | Status: DC | PRN

## 2016-11-28 NOTE — Anesthesia Postprocedure Evaluation (Signed)
Anesthesia Post Note  Patient: Tonya Rice  Procedure(s) Performed: Procedure(s) (LRB): Repeat CESAREAN SECTION (N/A)     Patient location during evaluation: Mother Baby Anesthesia Type: Spinal Level of consciousness: awake Pain management: pain level controlled Vital Signs Assessment: post-procedure vital signs reviewed and stable Respiratory status: spontaneous breathing Cardiovascular status: stable Postop Assessment: no headache, no backache, spinal receding, patient able to bend at knees, no signs of nausea or vomiting and adequate PO intake Anesthetic complications: no    Last Vitals:  Vitals:   11/28/16 0132 11/28/16 0514  BP: (!) 112/58 (!) 103/54  Pulse: 68 74  Resp: 18 18  Temp: 37.3 C 37.2 C    Last Pain:  Vitals:   11/28/16 0514  TempSrc: Oral  PainSc: 0-No pain   Pain Goal: Patients Stated Pain Goal: 3 (11/27/16 1740)               Daelin Haste

## 2016-11-28 NOTE — Lactation Note (Signed)
This note was copied from a baby's chart. Lactation Consultation Note: Mother unsure if she plans to be discharged today. Mother is a [employer], [employer] employee. She wants to get her pump but unsure which pump she wants.  Assist mother with postioning infant in cross cradle . Mother uncomfortable in this position. Advised mother to use off centered latch and wait for a wide open mouth. Mother advised to continue to cue base feed infant and at least 8-12 times in 24 hours. Discussed prevention of engorgement and advised to ice breast for 15 mins every 3-4 hours to reduce swelling.  [facility].   Patient Name: Tonya Hortense RamalCybill Roets [id number]'XToday's Date: 11/28/2016 Reason for consult: Follow-up assessment   Maternal Data    Feeding Feeding Type: Breast Fed  LATCH Score/Interventions Latch: Repeated attempts needed to sustain latch, nipple held in mouth throughout feeding, stimulation needed to elicit sucking reflex.  Audible Swallowing: A few with stimulation  Type of Nipple: Everted at rest and after stimulation  Comfort (Breast/Nipple): Filling, red/small blisters or bruises, mild/mod discomfort  Problem noted: Mild/Moderate discomfort Interventions  (Cracked/bleeding/bruising/blister): Other (comment) (assist with deeper latch)  Hold (Positioning): No assistance needed to correctly position infant at breast. Intervention(s): Support Pillows;Position options  LATCH Score: 7  Lactation Tools Discussed/Used     Consult Status Consult Status: Follow-up Date: 11/28/16 Follow-up type: In-patient    Tonya Rice, [facility] Rice [facility] 11/28/2016, 10:59 AM

## 2016-11-28 NOTE — Addendum Note (Signed)
Addendum  created 11/28/16 [id number] by Renford DillsMullins, Luciana Cammarata L, CRNA   Sign clinical note

## 2016-11-28 NOTE — Progress Notes (Signed)
Subjective: POD# 1 Information for the patient's newborn:  Enriqueta Shutterckelmann, Boy Tniya [[id number]][[id number]]  female   circ planned today Baby name: Francesco SorLincoln  Reports feeling well, considering early DC.  Feeding: breast Patient reports tolerating PO.  Breast symptoms:+ colostrum Pain controlled withacetaminophen and ibuprofen (OTC) Denies HA/SOB/C/P/N/V/dizziness. Flatus absent. She reports vaginal bleeding as normal, without clots.  She is ambulating, urinating without difficulty.     Objective:   VS:    Vitals:   11/27/16 1740 11/27/16 2136 11/28/16 0132 11/28/16 0514  BP: 108/63 114/67 (!) 112/58 (!) 103/54  Pulse: 67 80 68 74  Resp: 18 18 18 18   Temp: 98.5 F (36.9 C) 98.3 F (36.8 C) 99.1 F (37.3 C) 98.9 F (37.2 C)  TempSrc: Oral Oral Oral Oral  SpO2: 96% 97% 100% 100%     Intake/Output Summary (Last 24 hours) at 11/28/16 0920 Last data filed at 11/28/16 0514  Gross per 24 hour  Intake          3054.38 ml  Output             3005 ml  Net            49.38 ml        Recent Labs  11/26/16 1145 11/28/16 0511  WBC 7.8 10.1  HGB 12.6 11.0*  HCT 36.7 31.6*  PLT 120* 116*     Blood type: --/--/A POS (06/22 1145)  Rubella: Immune (12/18 0000)     Physical Exam:  General: alert, cooperative and no distress CV: Regular rate and rhythm Resp: clear Abdomen: soft, nontender, normal bowel sounds Incision: clean, dry and intact Uterine Fundus: firm, below umbilicus, nontender Lochia: minimal Ext: no edema, redness or tenderness in the calves or thighs      Assessment/Plan: 31 y.o.   POD# 1. [id number]                  Principal Problem:   Postpartum care following cesarean delivery (6/23) Active Problems:   Malpresentation of fetus   Cesarean delivery delivered - indication: rpt and breech   Benign gestational thrombocytopenia (HCC)  - stable plts, no evidence of hemorrhage  Doing well, stable.               Advance diet as tolerated Encourage rest when baby  rests Breastfeeding support Encourage to ambulate, warm fluids  Routine post-op care May consider DC home this evening if desires and flatus present  Neta Mendsaniela C Paul, CNM, MSN 11/28/2016, 9:20 AM

## 2016-11-29 LAB — BIRTH TISSUE RECOVERY COLLECTION (PLACENTA DONATION)

## 2016-11-29 MED ORDER — IBUPROFEN 600 MG PO TABS: 600.0000 mg | ORAL_TABLET | Freq: Four times a day (QID) | ORAL | 0 refills | Status: AC

## 2016-11-29 NOTE — Lactation Note (Signed)
This note was copied from a baby's chart. Lactation Consultation Note  Patient Name: Tonya Hortense RamalCybill Ellingwood ZOXWR'UToday's Date: 11/29/2016 Reason for consult: Follow-up assessment Mom had baby latched to left breast in cradle hold. Baby demonstrating good suckling bursts. Mom c/o right nipple tenderness. LC reviewed positioning and offered assistance, Mom reports she will call with next feeding. Engorgement care reviewed if needed. LC left phone number for Mom to call for latch assist on right breast.   Maternal Data    Feeding Feeding Type: Breast Fed Length of feed: 10 min  LATCH Score/Interventions                      Lactation Tools Discussed/Used     Consult Status Consult Status: Follow-up Date: 11/29/16 Follow-up type: In-patient    Alfred LevinsGranger, Bern Fare Ann 11/29/2016, 9:45 AM

## 2016-11-29 NOTE — Progress Notes (Signed)
POSTOPERATIVE DAY # 2 S/P Repeat LTCS   S:         Reports feeling good and ready to be discharged home  States baby can be discharged after 1pm today per Peds  Feeling some soreness and left shoulder pain due to flatus, but states she is passing flatus              Tolerating po intake / no nausea / no vomiting / + flatus / no BM             Bleeding is light             Pain controlled with Motrin and Tylenol - declined Percocet rx to go home              Up ad lib / ambulatory/ voiding QS  Newborn breast feeding - going well / Circumcision - done yesterday   O:  VS: BP 118/62 (BP Location: Right Arm)   Pulse 63   Temp 98.8 F (37.1 C) (Oral)   Resp 16   SpO2 100%    LABS:               Recent Labs  11/26/16 1145 11/28/16 0511  WBC 7.8 10.1  HGB 12.6 11.0*  PLT 120* 116*               Bloodtype: --/--/A POS (06/22 1145)  Rubella: Immune (12/18 0000)                                             I&O: Intake/Output      06/24 0701 - 06/25 0700 06/25 0701 - 06/26 0700   P.O.     I.V.     Total Intake       Urine 550    Blood     Total Output 550     Net -550                       Physical Exam:             Alert and Oriented X3  Lungs: Clear and unlabored  Heart: regular rate and rhythm / no mumurs  Abdomen: soft, non-tender, non-distended, hypoactive bowel sounds present in all quadrants             Fundus: firm, non-tender, U-2             Dressing: honeycomb dsg with steri-strips c/d/i              Incision:  approximated with sutures / no erythema / no ecchymosis / no drainage  Perineum: intact  Lochia: appropriate, no clots  Extremities: trace pedal edema, no calf pain or tenderness,  A:        POD # 1 S/P Repeat LTCS             Benign Gestational Thrombocytopenia - stable  P:        Routine postoperative care              Discharge home today  Discharge instructions and warning s/s reviewed   WOB booklet given   Percocet rx previously printed  yesterday by Arlan Organaniela Paul, CNM - pt. Declined rx -so it was shredded and placed in secure shred box by me  F/u with Dr. Juliene PinaMody in 6 weeks  Carlean Jews, MSN, CNM [facility]

## 2016-11-29 NOTE — Discharge Summary (Signed)
Obstetric Discharge Summary   Patient Name: Tonya Rice DOB: [birth year] MRN: [id number]  Date of Admission: 11/27/2016 Date of Discharge: 11/29/2016 Date of Delivery: 11/27/16 Gestational Age at Delivery: [redacted]w[redacted]d  Primary OB: [facility] - Dr. Juliene Pina  Antepartum complications:  - Hx. Of prior C/S for Breech - Fetal malpresentation: complete breech - GBS positive  Prenatal Labs:  ABO, Rh: --/--/A POS (06/22 1145) Antibody: NEG (06/22 1145) Rubella: Immune (12/18 0000) RPR: Non Reactive (06/22 1145)  HBsAg: Negative (12/18 0000)  HIV: Non-reactive (12/18 0000)  GBS: Positive (06/04 0000)  Admitting Diagnosis: 39 weeks prior C/S, now breech, now repeat C/S  Secondary Diagnoses: Patient Active Problem List   Diagnosis Date Noted  . Benign gestational thrombocytopenia (HCC) 11/28/2016  . Malpresentation of fetus 11/27/2016  . Cesarean delivery delivered - indication: rpt and breech 11/27/2016  . Postpartum care following cesarean delivery (6/23) 08/21/2012   Date of Delivery: 11/27/16 Delivered By: Dr. Juliene Pina with D. Renae Fickle, CNM as assist Delivery Type: repeat cesarean section, low transverse incision Anesthesia: spinal Placenta: manual Newborn Data: Live born female  Birth Weight: 7 lb 9 oz (3430 g) APGAR: 10, 10  Postpartum Course  (Cesarean Section):  Patient had an uncomplicated postpartum course.  By time of discharge on POD#2, her pain was controlled on oral pain medications; she had appropriate lochia and was ambulating, voiding without difficulty, tolerating regular diet and passing flatus.   She was deemed stable for discharge to home.     Labs: CBC Latest Ref Rng & Units 11/28/2016 11/26/2016 08/21/2012  WBC 4.0 - 10.5 K/uL 10.1 7.8 19.1(H)  Hemoglobin 12.0 - 15.0 g/dL 11.0(L) 12.6 11.2(L)  Hematocrit 36.0 - 46.0 % 31.6(L) 36.7 32.8(L)  Platelets 150 - 400 K/uL 116(L) 120(L) 118(L)   A POS  Physical exam:  BP 118/62 (BP Location: Right Arm)   Pulse 63    Temp 98.8 F (37.1 C) (Oral)   Resp 16   SpO2 100%  General: alert and no distress Pulm: normal respiratory effort Lochia: appropriate Abdomen: soft, NT Uterine Fundus: firm, below umbilicus Incision: c/d/i, healing well, no significant drainage, no dehiscence, no significant erythema Extremities: No evidence of DVT seen on physical exam. No lower extremity edema.  Disposition: stable, discharge to home Baby Feeding: breast milk Baby Disposition: home with mom  Contraception:   Rh Immune globulin given: N/A Rubella vaccine given: N/A Tdap vaccine given in AP or PP setting: UTD Flu vaccine given in AP or PP setting: N/A   Plan:  Janifer E Laurie was discharged to home in good condition. Follow-up appointment at [facility] in 6 weeks.  Discharge Instructions: Per After Visit Summary. Activity: Advance as tolerated. Pelvic rest for 6 weeks.  Refer to After Visit Summary Diet: Regular Discharge Medications:  Pt. Declined Percocet prescription at discharge.  Prescription shredded and put in shred box by me.  Allergies as of 11/29/2016   No Known Allergies     Medication List    STOP taking these medications   amoxicillin 875 MG tablet Commonly known as:  AMOXIL     TAKE these medications   acetaminophen 500 MG tablet Commonly known as:  TYLENOL Take 1,000 mg by mouth every 6 (six) hours as needed for mild pain or headache.   calcium carbonate 500 MG chewable tablet Commonly known as:  TUMS - dosed in mg elemental calcium Chew 2 tablets by mouth 2 (two) times daily as needed for indigestion or heartburn.   coconut oil  Oil Apply 1 application topically as needed.   ibuprofen 600 MG tablet Commonly known as:  ADVIL,MOTRIN Take 1 tablet (600 mg total) by mouth every 6 (six) hours. What changed:  when to take this  reasons to take this   prenatal multivitamin Tabs tablet Take 1 tablet by mouth daily.   simethicone 80 MG chewable tablet Commonly known  as:  MYLICON Chew 1 tablet (80 mg total) by mouth 4 (four) times daily - after meals and at bedtime.      Outpatient follow up:  Follow-up Information    Shea EvansMody, Vaishali, MD. Schedule an appointment as soon as possible for a visit in 6 week(s).   Specialty:  Obstetrics and Gynecology Why:  Postpartum visit  Contact information: [address] [place] [id number] [phone]           Signed:  Karena AddisonMeredith C Sigmon, MSN, CNM [facility]

## 2017-01-11 MED FILL — NORETHINDRONE 0.35 MG TAB: 0.35 | 84 days supply | Qty: 84 | Fill #0

## 2017-04-07 MED FILL — NORETHINDRONE 0.35 MG TAB: 0.35 | 84 days supply | Qty: 84 | Fill #1

## 2017-04-11 ENCOUNTER — Encounter (HOSPITAL_COMMUNITY): Payer: Self-pay

## 2017-06-28 MED FILL — NORETHINDRONE 0.35 MG TAB: 0.35 | 84 days supply | Qty: 84 | Fill #0

## 2017-07-05 DIAGNOSIS — Z6821 Body mass index (BMI) 21.0-21.9, adult: Secondary | ICD-10-CM | POA: Diagnosis not present

## 2017-07-05 DIAGNOSIS — J3089 Other allergic rhinitis: Secondary | ICD-10-CM | POA: Diagnosis not present

## 2017-07-05 DIAGNOSIS — J09X2 Influenza due to identified novel influenza A virus with other respiratory manifestations: Secondary | ICD-10-CM | POA: Diagnosis not present

## 2017-07-05 DIAGNOSIS — J111 Influenza due to unidentified influenza virus with other respiratory manifestations: Secondary | ICD-10-CM | POA: Diagnosis not present

## 2017-07-05 DIAGNOSIS — H6691 Otitis media, unspecified, right ear: Secondary | ICD-10-CM | POA: Diagnosis not present

## 2017-07-05 MED FILL — AMOX-CLAV 875-125 MG TABLET: 875-125 | 7 days supply | Qty: 14 | Fill #0

## 2017-07-05 MED FILL — OSELTAMIVIR PHOSPHATE 75 MG: 75 | 5 days supply | Qty: 10 | Fill #0

## 2017-09-16 MED FILL — NORETHINDRONE 0.35 MG TAB: 0.35 | 84 days supply | Qty: 84 | Fill #1

## 2017-10-25 DIAGNOSIS — J309 Allergic rhinitis, unspecified: Secondary | ICD-10-CM | POA: Diagnosis not present

## 2017-10-25 DIAGNOSIS — J020 Streptococcal pharyngitis: Secondary | ICD-10-CM | POA: Diagnosis not present

## 2017-10-25 DIAGNOSIS — J029 Acute pharyngitis, unspecified: Secondary | ICD-10-CM | POA: Diagnosis not present

## 2017-10-25 DIAGNOSIS — Z6821 Body mass index (BMI) 21.0-21.9, adult: Secondary | ICD-10-CM | POA: Diagnosis not present

## 2017-10-25 MED FILL — AMOX-CLAV 875-125 MG TABLET: 875-125 | 10 days supply | Qty: 20 | Fill #0

## 2017-11-15 DIAGNOSIS — Z6820 Body mass index (BMI) 20.0-20.9, adult: Secondary | ICD-10-CM | POA: Diagnosis not present

## 2017-11-15 DIAGNOSIS — J020 Streptococcal pharyngitis: Secondary | ICD-10-CM | POA: Diagnosis not present

## 2017-11-15 DIAGNOSIS — Z0000 Encounter for general adult medical examination without abnormal findings: Secondary | ICD-10-CM | POA: Diagnosis not present

## 2017-11-15 DIAGNOSIS — G43909 Migraine, unspecified, not intractable, without status migrainosus: Secondary | ICD-10-CM | POA: Diagnosis not present

## 2017-11-15 DIAGNOSIS — N390 Urinary tract infection, site not specified: Secondary | ICD-10-CM | POA: Diagnosis not present

## 2017-11-15 DIAGNOSIS — Z1389 Encounter for screening for other disorder: Secondary | ICD-10-CM | POA: Diagnosis not present

## 2017-11-15 DIAGNOSIS — H6691 Otitis media, unspecified, right ear: Secondary | ICD-10-CM | POA: Diagnosis not present

## 2017-11-15 DIAGNOSIS — J3089 Other allergic rhinitis: Secondary | ICD-10-CM | POA: Diagnosis not present

## 2017-11-15 MED FILL — AMOX-CLAV 875-125 MG TABLET: 875-125 | 5 days supply | Qty: 10 | Fill #0

## 2017-12-06 DIAGNOSIS — Z6820 Body mass index (BMI) 20.0-20.9, adult: Secondary | ICD-10-CM | POA: Diagnosis not present

## 2017-12-06 DIAGNOSIS — J020 Streptococcal pharyngitis: Secondary | ICD-10-CM | POA: Diagnosis not present

## 2017-12-06 DIAGNOSIS — J029 Acute pharyngitis, unspecified: Secondary | ICD-10-CM | POA: Diagnosis not present

## 2017-12-06 MED FILL — CEFDINIR 300 MG CAPSULE: 300 | 10 days supply | Qty: 20 | Fill #0

## 2017-12-06 MED FILL — NORETHIN-ESTRAD-FERR 1-0.02: 1-20 | 28 days supply | Qty: 28 | Fill #0

## 2017-12-19 DIAGNOSIS — H1045 Other chronic allergic conjunctivitis: Secondary | ICD-10-CM | POA: Diagnosis not present

## 2017-12-19 DIAGNOSIS — J301 Allergic rhinitis due to pollen: Secondary | ICD-10-CM | POA: Diagnosis not present

## 2017-12-19 DIAGNOSIS — R51 Headache: Secondary | ICD-10-CM | POA: Diagnosis not present

## 2017-12-19 DIAGNOSIS — J3089 Other allergic rhinitis: Secondary | ICD-10-CM | POA: Diagnosis not present

## 2017-12-19 MED FILL — DESLORATADINE 5 MG TAB: 5 | 30 days supply | Qty: 30 | Fill #0

## 2017-12-19 MED FILL — FLUTICASONE PROP 50 MCG SPR: 50 | 60 days supply | Qty: 16 | Fill #0

## 2018-01-10 MED FILL — NORETHIN-ESTRAD-FERR 1-0.02: 1-20 | 28 days supply | Qty: 28 | Fill #1

## 2018-01-19 MED FILL — DESLORATADINE 5 MG TAB: 5 | 30 days supply | Qty: 30 | Fill #1

## 2018-02-07 MED FILL — NORETHIN-ESTRAD-FERR 1-0.02: 1-20 | 28 days supply | Qty: 28 | Fill #0

## 2018-02-15 DIAGNOSIS — Z01419 Encounter for gynecological examination (general) (routine) without abnormal findings: Secondary | ICD-10-CM | POA: Diagnosis not present

## 2018-02-15 DIAGNOSIS — Z6820 Body mass index (BMI) 20.0-20.9, adult: Secondary | ICD-10-CM | POA: Diagnosis not present

## 2018-02-15 DIAGNOSIS — R87610 Atypical squamous cells of undetermined significance on cytologic smear of cervix (ASC-US): Secondary | ICD-10-CM | POA: Diagnosis not present

## 2018-02-22 MED FILL — DESLORATADINE 5 MG TAB: 5 | 30 days supply | Qty: 30 | Fill #2

## 2018-02-22 MED FILL — FLUTICASONE PROP 50 MCG SPR: 50 | 60 days supply | Qty: 16 | Fill #1

## 2018-03-06 MED FILL — NORG-EE 0.18-0.215-0.25/0.0: 0.18/0.215/ | 84 days supply | Qty: 84 | Fill #0

## 2018-03-17 MED FILL — DESLORATADINE 5 MG TAB: 5 | 30 days supply | Qty: 30 | Fill #3

## 2018-04-01 DIAGNOSIS — H52223 Regular astigmatism, bilateral: Secondary | ICD-10-CM | POA: Diagnosis not present

## 2018-04-03 DIAGNOSIS — J3089 Other allergic rhinitis: Secondary | ICD-10-CM | POA: Diagnosis not present

## 2018-04-03 DIAGNOSIS — Z7289 Other problems related to lifestyle: Secondary | ICD-10-CM | POA: Diagnosis not present

## 2018-04-03 DIAGNOSIS — G43109 Migraine with aura, not intractable, without status migrainosus: Secondary | ICD-10-CM | POA: Diagnosis not present

## 2018-04-03 DIAGNOSIS — R438 Other disturbances of smell and taste: Secondary | ICD-10-CM | POA: Diagnosis not present

## 2018-04-04 ENCOUNTER — Other Ambulatory Visit (HOSPITAL_COMMUNITY): Payer: Self-pay | Admitting: Otolaryngology

## 2018-04-04 DIAGNOSIS — G43109 Migraine with aura, not intractable, without status migrainosus: Secondary | ICD-10-CM

## 2018-04-04 DIAGNOSIS — J3089 Other allergic rhinitis: Secondary | ICD-10-CM

## 2018-04-04 MED FILL — NITROFURANTOIN MONO-MCR 100: 100 | 5 days supply | Qty: 10 | Fill #0

## 2018-04-18 ENCOUNTER — Other Ambulatory Visit (HOSPITAL_COMMUNITY): Payer: Self-pay | Admitting: Otolaryngology

## 2018-04-18 DIAGNOSIS — J3089 Other allergic rhinitis: Secondary | ICD-10-CM

## 2018-04-18 DIAGNOSIS — G43109 Migraine with aura, not intractable, without status migrainosus: Secondary | ICD-10-CM

## 2018-04-24 ENCOUNTER — Ambulatory Visit (HOSPITAL_COMMUNITY)
Admission: RE | Admit: 2018-04-24 | Discharge: 2018-04-24 | Disposition: A | Discharge status: Home / Self Care | Payer: 59 | Source: Ambulatory Visit | Attending: Otolaryngology | Admitting: Otolaryngology

## 2018-04-24 ENCOUNTER — Ambulatory Visit (HOSPITAL_COMMUNITY): Payer: 59

## 2018-04-24 DIAGNOSIS — J3089 Other allergic rhinitis: Secondary | ICD-10-CM | POA: Diagnosis not present

## 2018-04-24 DIAGNOSIS — G43109 Migraine with aura, not intractable, without status migrainosus: Secondary | ICD-10-CM | POA: Insufficient documentation

## 2018-05-02 MED FILL — DESLORATADINE 5 MG TAB: 5 | 30 days supply | Qty: 30 | Fill #4

## 2018-05-02 MED FILL — FLUTICASONE PROP 50 MCG SPR: 50 | 60 days supply | Qty: 16 | Fill #2

## 2018-05-30 MED FILL — NORG-EE 0.18-0.215-0.25/0.0: 0.18/0.215/ | 84 days supply | Qty: 84 | Fill #1

## 2018-05-30 MED FILL — DESLORATADINE 5 MG TAB: 5 | 30 days supply | Qty: 30 | Fill #5

## 2018-07-03 DIAGNOSIS — J329 Chronic sinusitis, unspecified: Secondary | ICD-10-CM | POA: Diagnosis not present

## 2018-07-03 DIAGNOSIS — J029 Acute pharyngitis, unspecified: Secondary | ICD-10-CM | POA: Diagnosis not present

## 2018-07-03 DIAGNOSIS — J111 Influenza due to unidentified influenza virus with other respiratory manifestations: Secondary | ICD-10-CM | POA: Diagnosis not present

## 2018-07-03 DIAGNOSIS — Z6821 Body mass index (BMI) 21.0-21.9, adult: Secondary | ICD-10-CM | POA: Diagnosis not present

## 2018-07-03 MED FILL — DESLORATADINE 5 MG TAB: 5 | 30 days supply | Qty: 30 | Fill #6

## 2018-07-03 MED FILL — FLUTICASONE PROP 50 MCG SPR: 50 | 60 days supply | Qty: 16 | Fill #3

## 2018-07-03 MED FILL — AMOX-CLAV 875-125 MG TABLET: 875-125 | 7 days supply | Qty: 14 | Fill #0

## 2018-08-22 MED FILL — OSELTAMIVIR PHOSPHATE 75 MG: 75 | 10 days supply | Qty: 10 | Fill #0

## 2018-08-24 MED FILL — FLUTICASONE PROP 50 MCG SPR: 50 | 60 days supply | Qty: 16 | Fill #4

## 2018-08-24 MED FILL — NORG-EE 0.18-0.215-0.25/0.0: 0.18/0.215/ | 84 days supply | Qty: 84 | Fill #2

## 2018-08-24 MED FILL — DESLORATADINE 5 MG TAB: 5 | 30 days supply | Qty: 30 | Fill #0 | Status: TO

## 2018-09-27 DIAGNOSIS — J329 Chronic sinusitis, unspecified: Secondary | ICD-10-CM | POA: Diagnosis not present

## 2018-09-27 DIAGNOSIS — J309 Allergic rhinitis, unspecified: Secondary | ICD-10-CM | POA: Diagnosis not present

## 2018-09-27 MED FILL — DESLORATADINE 5 MG TAB: 5 | 30 days supply | Qty: 30 | Fill #0

## 2018-10-16 DIAGNOSIS — N390 Urinary tract infection, site not specified: Secondary | ICD-10-CM | POA: Diagnosis not present

## 2018-10-27 MED FILL — DESLORATADINE 5 MG TAB: 5 | 30 days supply | Qty: 30 | Fill #1

## 2018-11-15 MED FILL — TRI-LO-SPRINTEC TABLET: 0.18/0.215/ | 84 days supply | Qty: 84 | Fill #3

## 2019-01-03 MED FILL — DESLORATADINE 5 MG TAB: 5 | 30 days supply | Qty: 30 | Fill #0

## 2019-01-03 MED FILL — FLUTICASONE PROP 50 MCG SPR: 50 | 60 days supply | Qty: 16 | Fill #0

## 2019-02-01 MED FILL — TRI-LO-SPRINTEC TABLET: 0.18/0.215/ | 84 days supply | Qty: 84 | Fill #4

## 2019-02-01 MED FILL — DESLORATADINE 5 MG TAB: 5 | 30 days supply | Qty: 30 | Fill #1

## 2019-03-09 MED FILL — DESLORATADINE 5 MG TAB: 5 | 30 days supply | Qty: 30 | Fill #2

## 2019-04-09 MED FILL — FLUTICASONE PROP 50 MCG SPR: 50 | 60 days supply | Qty: 16 | Fill #1

## 2019-04-26 MED FILL — DESLORATADINE 5 MG TAB: 5 | 30 days supply | Qty: 30 | Fill #0

## 2019-04-27 MED FILL — NORG-EE 0.18-0.215-0.25/0.0: 0.18/0.215/ | 28 days supply | Qty: 28 | Fill #0

## 2019-05-10 DIAGNOSIS — R519 Headache, unspecified: Secondary | ICD-10-CM | POA: Diagnosis not present

## 2019-05-10 DIAGNOSIS — J301 Allergic rhinitis due to pollen: Secondary | ICD-10-CM | POA: Diagnosis not present

## 2019-05-10 DIAGNOSIS — H1045 Other chronic allergic conjunctivitis: Secondary | ICD-10-CM | POA: Diagnosis not present

## 2019-05-10 DIAGNOSIS — J3089 Other allergic rhinitis: Secondary | ICD-10-CM | POA: Diagnosis not present

## 2019-05-10 MED FILL — AZELASTINE HCL 137 MCG SPRY: 0.1 | 30 days supply | Qty: 30 | Fill #0

## 2019-05-15 DIAGNOSIS — Z6822 Body mass index (BMI) 22.0-22.9, adult: Secondary | ICD-10-CM | POA: Diagnosis not present

## 2019-05-15 DIAGNOSIS — Z01419 Encounter for gynecological examination (general) (routine) without abnormal findings: Secondary | ICD-10-CM | POA: Diagnosis not present

## 2019-05-21 MED FILL — LO LOESTRIN FE 1-10 TABLET: 1 MG-10 MCG | 84 days supply | Qty: 84 | Fill #0

## 2019-05-31 MED FILL — DESLORATADINE 5 MG TAB: 5 | 30 days supply | Qty: 30 | Fill #1

## 2019-07-16 MED FILL — DESLORATADINE 5 MG TAB: 5 | 30 days supply | Qty: 30 | Fill #2

## 2019-07-16 MED FILL — FLUTICASONE PROP 50 MCG SPR: 50 | 60 days supply | Qty: 16 | Fill #2

## 2019-08-14 MED FILL — DESLORATADINE 5 MG TABLET: 5 | 30 days supply | Qty: 30 | Fill #3

## 2019-08-14 MED FILL — LO LOESTRIN FE 1-10 TABLET: 1 MG-10 MCG | 84 days supply | Qty: 84 | Fill #1

## 2019-09-19 MED FILL — AZELASTINE 0.1% (137 MCG) S: 0.1 | 30 days supply | Qty: 30 | Fill #1

## 2019-09-19 MED FILL — DESLORATADINE 5 MG TABLET: 5 | 30 days supply | Qty: 30 | Fill #0

## 2019-09-19 MED FILL — FLUTICASONE PROP 50 MCG SPR: 50 | 60 days supply | Qty: 16 | Fill #3

## 2019-11-07 MED FILL — LO LOESTRIN FE 1-10 TABLET: 1 MG-10 MCG | 84 days supply | Qty: 84 | Fill #2

## 2019-11-07 MED FILL — DESLORATADINE 5 MG TABLET: 5 | 30 days supply | Qty: 30 | Fill #1

## 2019-12-12 MED FILL — FLUTICASONE PROP 50 MCG SPR: 50 | 60 days supply | Qty: 16 | Fill #4

## 2019-12-12 MED FILL — DESLORATADINE 5 MG TABLET: 5 | 30 days supply | Qty: 30 | Fill #2

## 2020-01-29 ENCOUNTER — Other Ambulatory Visit (HOSPITAL_COMMUNITY): Payer: Self-pay | Admitting: Allergy and Immunology

## 2020-01-29 MED FILL — LO LOESTRIN FE 1-10 TABLET: 1 MG-10 MCG | 84 days supply | Qty: 84 | Fill #3

## 2020-01-29 MED FILL — FLUTICASONE PROP 50 MCG SPR: 50 | 60 days supply | Qty: 16 | Fill #0

## 2020-01-29 MED FILL — DESLORATADINE 5 MG TAB: 5 | 30 days supply | Qty: 30 | Fill #3

## 2020-03-10 MED FILL — DESLORATADINE 5 MG TAB: 5 | 30 days supply | Qty: 30 | Fill #4

## 2020-03-12 ENCOUNTER — Other Ambulatory Visit (HOSPITAL_COMMUNITY): Payer: Self-pay | Admitting: Obstetrics & Gynecology

## 2020-03-12 DIAGNOSIS — R300 Dysuria: Secondary | ICD-10-CM | POA: Diagnosis not present

## 2020-03-12 DIAGNOSIS — R3915 Urgency of urination: Secondary | ICD-10-CM | POA: Diagnosis not present

## 2020-03-12 DIAGNOSIS — N898 Other specified noninflammatory disorders of vagina: Secondary | ICD-10-CM | POA: Diagnosis not present

## 2020-03-12 MED FILL — SULFAMETHOXAZOLE-TMP DS TAB: 800-160 | 3 days supply | Qty: 6 | Fill #0

## 2020-03-12 MED FILL — FLUCONAZOLE 150 MG TABS: 150 | 1 days supply | Qty: 1 | Fill #0

## 2020-03-18 ENCOUNTER — Other Ambulatory Visit (HOSPITAL_COMMUNITY): Payer: Self-pay | Admitting: Obstetrics & Gynecology

## 2020-03-18 MED FILL — FLUCONAZOLE 150 MG TABS: 150 | 1 days supply | Qty: 1 | Fill #0

## 2020-04-29 MED FILL — FLUTICASONE PROP 50 MCG SPR: 50 | 60 days supply | Qty: 16 | Fill #1

## 2020-04-29 MED FILL — DESLORATADINE 5 MG TAB: 5 | 30 days supply | Qty: 30 | Fill #5

## 2020-06-05 ENCOUNTER — Other Ambulatory Visit (HOSPITAL_COMMUNITY): Payer: Self-pay | Admitting: Allergy and Immunology

## 2020-06-05 MED FILL — DESLORATADINE 5 MG TAB: 5 | 30 days supply | Qty: 30 | Fill #0

## 2020-07-18 MED FILL — DESLORATADINE 5 MG TAB: 5 | 30 days supply | Qty: 30 | Fill #1

## 2020-07-18 MED FILL — FLUTICASONE PROP 50 MCG SPR: 50 | 60 days supply | Qty: 16 | Fill #2

## 2020-07-24 ENCOUNTER — Encounter (HOSPITAL_COMMUNITY): Payer: Self-pay

## 2020-07-24 ENCOUNTER — Ambulatory Visit (HOSPITAL_COMMUNITY)
Admission: EM | Admit: 2020-07-24 | Discharge: 2020-07-24 | Disposition: A | Discharge status: Home / Self Care | Payer: 59

## 2020-07-24 ENCOUNTER — Ambulatory Visit (INDEPENDENT_AMBULATORY_CARE_PROVIDER_SITE_OTHER): Payer: 59

## 2020-07-24 DIAGNOSIS — M7989 Other specified soft tissue disorders: Secondary | ICD-10-CM | POA: Diagnosis not present

## 2020-07-24 DIAGNOSIS — S92354A Nondisplaced fracture of fifth metatarsal bone, right foot, initial encounter for closed fracture: Secondary | ICD-10-CM

## 2020-07-24 DIAGNOSIS — M25571 Pain in right ankle and joints of right foot: Secondary | ICD-10-CM | POA: Diagnosis not present

## 2020-07-24 DIAGNOSIS — S92351A Displaced fracture of fifth metatarsal bone, right foot, initial encounter for closed fracture: Secondary | ICD-10-CM | POA: Diagnosis not present

## 2020-07-24 NOTE — ED Notes (Signed)
Called ortho tech for splint application

## 2020-07-24 NOTE — ED Notes (Signed)
Called to cancel ortho tech for splint placement. Orders changed to post op boot

## 2020-07-24 NOTE — ED Provider Notes (Signed)
[facility]    CSN: [id number] Arrival date & time: 07/24/20  1331      History   Chief Complaint No chief complaint on file.   HPI Tonya Rice is a 35 y.o. female.   Patient presents with right foot swelling and pain at 7/10 radiating across top of foot, after rolling ankle outwards while at work.  Pain was at 10/10 initially but decreased after taking ibuprofen. 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 Edema has moved up calf. Denies numbness and tingling.   Past Medical History:  Diagnosis Date  . Hx of varicella   . Medical history non-contributory     Patient Active Problem List   Diagnosis Date Noted  . Benign gestational thrombocytopenia (HCC) 11/28/2016  . Malpresentation of fetus 11/27/2016  . Cesarean delivery delivered - indication: rpt and breech 11/27/2016  . Postpartum care following cesarean delivery (6/23) 08/21/2012    Past Surgical History:  Procedure Laterality Date  . CESAREAN SECTION N/A 08/20/2012   Procedure: CESAREAN SECTION;  Surgeon: Robley Fries, MD;  Location: [facility];  Service: Obstetrics;  Laterality: N/A;  . CESAREAN SECTION N/A 11/27/2016   Procedure: Repeat CESAREAN SECTION;  Surgeon: Shea Evans, MD;  Location: [facility];  Service: Obstetrics;  Laterality: N/A;  EDD: 12/04/16  . WISDOM TOOTH EXTRACTION      OB History    Gravida  2   Para  1   Term  1   Preterm  0   AB  0   Living  1     SAB  0   IAB  0   Ectopic  0   Multiple  0   Live Births  1            Home Medications    Prior to Admission medications   Medication Sig Start Date End Date Taking? Authorizing Provider  acetaminophen (TYLENOL) 500 MG tablet Take 1,000 mg by mouth every 6 (six) hours as needed for mild pain or headache.    [provider]  calcium carbonate (TUMS - DOSED IN MG ELEMENTAL CALCIUM) 500 MG chewable tablet Chew 2 tablets by mouth 2 (two) times daily as needed for  indigestion or heartburn.    [provider]  coconut oil OIL Apply 1 application topically as needed. 11/28/16   Neta Mends, CNM  ibuprofen (ADVIL,MOTRIN) 600 MG tablet Take 1 tablet (600 mg total) by mouth every 6 (six) hours. 11/29/16   Karena Addison, CNM  Prenatal Vit-Fe Fumarate-FA (PRENATAL MULTIVITAMIN) TABS Take 1 tablet by mouth daily.    [provider]  simethicone (MYLICON) 80 MG chewable tablet Chew 1 tablet (80 mg total) by mouth 4 (four) times daily - after meals and at bedtime. Patient not taking: Reported on 11/25/2016 08/22/12   Earl Gala, CNM    Family History Family History  Problem Relation Age of Onset  . Cervical cancer Maternal Aunt   . Lung cancer Maternal Grandmother   . Stroke Maternal Grandmother   . Congenital heart disease Maternal Grandfather   . COPD Maternal Grandfather     Social History Social History   Tobacco Use  . Smoking status: Never Smoker  . Smokeless tobacco: Never Used  Substance Use Topics  . Alcohol use: No  . Drug use: No     Allergies   Patient has no known allergies.   Review of Systems Review of Systems  Constitutional:  Negative.   HENT: Negative.   Respiratory: Negative.   Cardiovascular: Negative.   Gastrointestinal: Negative.   Genitourinary: Negative.   Musculoskeletal: Negative.   Skin: Negative.   Neurological: Negative.      Physical Exam Triage Vital Signs ED Triage Vitals  Enc Vitals Group     BP 07/24/20 1428 115/74     Pulse Rate 07/24/20 1428 73     Resp 07/24/20 1428 18     Temp 07/24/20 1428 98.3 F (36.8 C)     Temp Source 07/24/20 1428 Oral     SpO2 07/24/20 1428 98 %     Weight --      Height --      Head Circumference --      Peak Flow --      Pain Score 07/24/20 1426 7     Pain Loc --      Pain Edu? --      Excl. in GC? --    No data found.  Updated Vital Signs BP 115/74 (BP Location: Right Arm)   Pulse 73   Temp 98.3 F (36.8 C) (Oral)   Resp  18   LMP 07/04/2020 (Approximate)   SpO2 98%   Visual Acuity Right Eye Distance:   Left Eye Distance:   Bilateral Distance:    Right Eye Near:   Left Eye Near:    Bilateral Near:     Physical Exam Constitutional:      Appearance: Normal appearance. She is normal weight.  HENT:     Head: Normocephalic.  Eyes:     Extraocular Movements: Extraocular movements intact.  Cardiovascular:     Pulses: Normal pulses.     Heart sounds: Normal heart sounds.  Pulmonary:     Effort: Pulmonary effort is normal.  Musculoskeletal:     Cervical back: Normal range of motion.     Right foot: Normal range of motion. No deformity, foot drop, laceration or bony tenderness. Normal pulse.     Comments: Tenderness directly over base of 5th metatarsal, swelling over medial top of foot and entire lower leg   Skin:    General: Skin is warm and dry.  Neurological:     General: No focal deficit present.     Mental Status: She is alert and oriented to person, place, and time. Mental status is at baseline.      UC Treatments / Results  Labs (all labs ordered are listed, but only abnormal results are displayed) Labs Reviewed - No data to display  EKG   Radiology No results found.  Procedures Procedures (including critical care time)  Medications Ordered in UC Medications - No data to display  Initial Impression / Assessment and Plan / UC Course  I have reviewed the triage vital signs and the nursing notes.  Pertinent labs & imaging results that were available during my care of the patient were reviewed by me and considered in my medical decision making (see chart for details).  Nondisplaced fracture of 5th metatarsal bone of right foot  1.cam boot placed, partial weight bearing with crutches 2. Ibuprofen 800 mg tid as needed 3. Ice foot 15 minutes intervals as needed 4. Elevate foot while in sitting position 5. Orthopedic referral for follow up in 3-5 days, specialist  notified.  Final Clinical Impressions(s) / UC Diagnoses   Final diagnoses:  None   Discharge Instructions   None    ED Prescriptions    None     PDMP  not reviewed this encounter.   Valinda Hoar, NP 07/24/20 [phone]

## 2020-07-24 NOTE — Discharge Instructions (Addendum)
Partial weight bearing on right meaning do not place all of body weight on to feet.  Can take 800 mg ibuprofen three times a day  Can use heat or ice in 15 minute intervals for additional comfort  Can elevate foot while sitting to aid in swelling   Follow up in urgent care for worsening pain, persistent swelling, signs of infection.Marland KitchenMarland Kitchen

## 2020-07-24 NOTE — ED Notes (Signed)
Fitted pt with medium sized cam boot walker and crutches and instructions provided for use of both.

## 2020-07-24 NOTE — ED Triage Notes (Signed)
Pt presents with pain on right foot x 2 days. Pt states she has noticed swelling around her foot radiating to her lower extremity of the leg. Pt states she still is able to move her foot with some pain.

## 2020-07-30 DIAGNOSIS — S92354A Nondisplaced fracture of fifth metatarsal bone, right foot, initial encounter for closed fracture: Secondary | ICD-10-CM | POA: Diagnosis not present

## 2020-08-14 DIAGNOSIS — S92354D Nondisplaced fracture of fifth metatarsal bone, right foot, subsequent encounter for fracture with routine healing: Secondary | ICD-10-CM | POA: Diagnosis not present

## 2020-08-18 ENCOUNTER — Other Ambulatory Visit (HOSPITAL_COMMUNITY): Payer: Self-pay | Admitting: Family Medicine

## 2020-08-18 DIAGNOSIS — N390 Urinary tract infection, site not specified: Secondary | ICD-10-CM | POA: Diagnosis not present

## 2020-08-18 DIAGNOSIS — R6883 Chills (without fever): Secondary | ICD-10-CM | POA: Diagnosis not present

## 2020-08-18 DIAGNOSIS — R8281 Pyuria: Secondary | ICD-10-CM | POA: Diagnosis not present

## 2020-08-18 DIAGNOSIS — Z1152 Encounter for screening for COVID-19: Secondary | ICD-10-CM | POA: Diagnosis not present

## 2020-08-18 DIAGNOSIS — M545 Low back pain, unspecified: Secondary | ICD-10-CM | POA: Diagnosis not present

## 2020-08-27 MED FILL — DESLORATADINE 5 MG TAB: 5 | 30 days supply | Qty: 30 | Fill #2

## 2020-08-29 DIAGNOSIS — S92354D Nondisplaced fracture of fifth metatarsal bone, right foot, subsequent encounter for fracture with routine healing: Secondary | ICD-10-CM | POA: Diagnosis not present

## 2020-09-11 DIAGNOSIS — S92354D Nondisplaced fracture of fifth metatarsal bone, right foot, subsequent encounter for fracture with routine healing: Secondary | ICD-10-CM | POA: Diagnosis not present

## 2020-09-11 IMAGING — CT CT MAXILLOFACIAL W/O CM
3 series · 15 of 47 positions shown, 18 images · non-contrast
Comparison: None.

CLINICAL DATA: Migraine with Aujla without status migrainosus, not
intractable. Non seasonal allergic rhinitis, unspecified trigger.

EXAM:
CT MAXILLOFACIAL WITHOUT CONTRAST
TECHNIQUE: Multidetector CT images of the paranasal sinuses were obtained using
the standard protocol without intravenous contrast.

[Series 3: max soft · axial · 0.27mm/px · z∈[-224,-78]mm · 9 of 85 slices shown, 12 images]
[im 6/85  brain]
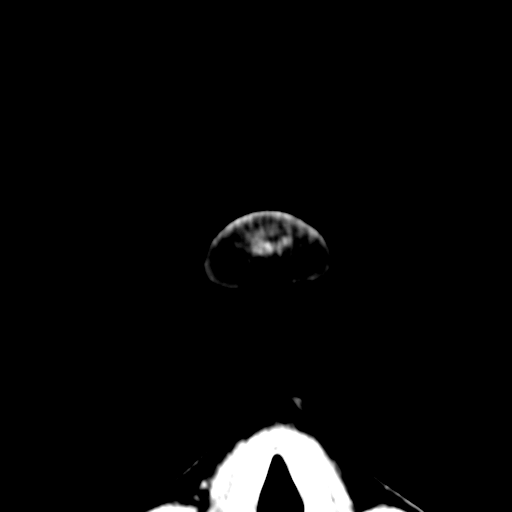
[im 6/85  bone]
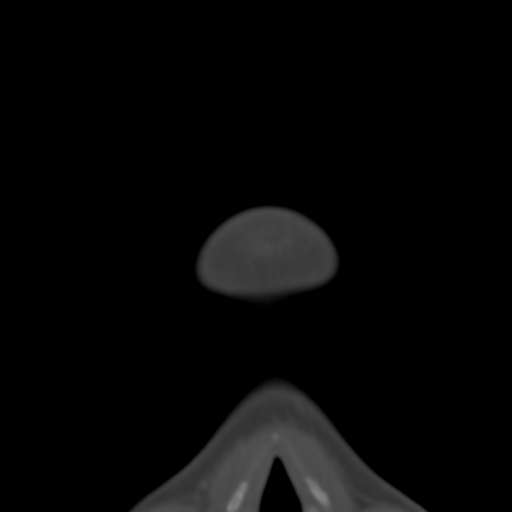
[im 15/85  bone]
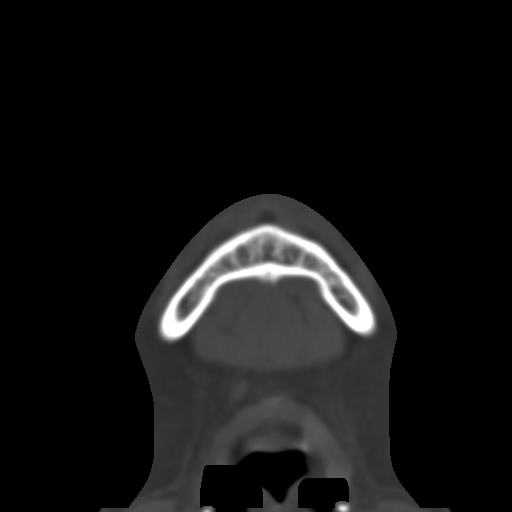
[im 24/85  bone]
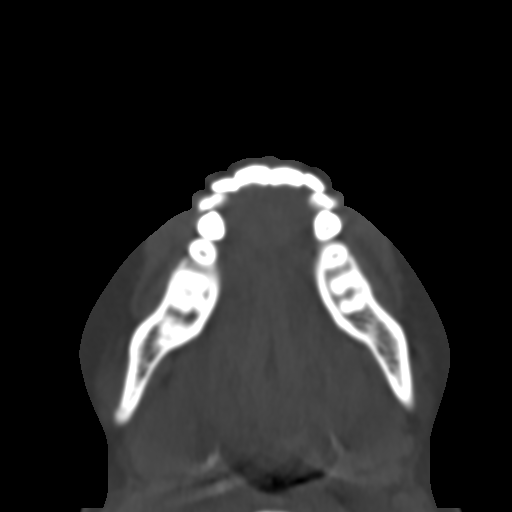
[im 32/85  bone]
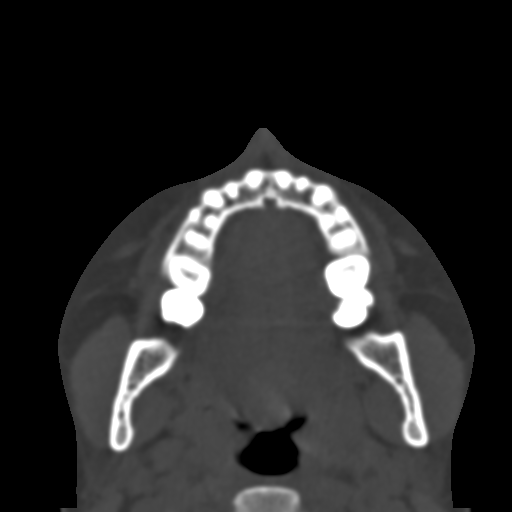
[im 44/85  brain]
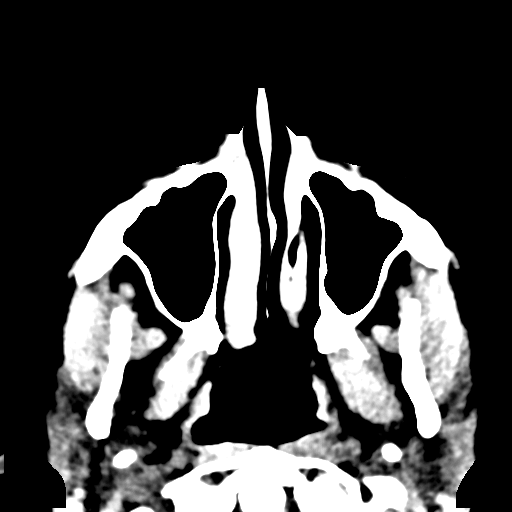
[im 44/85  bone]
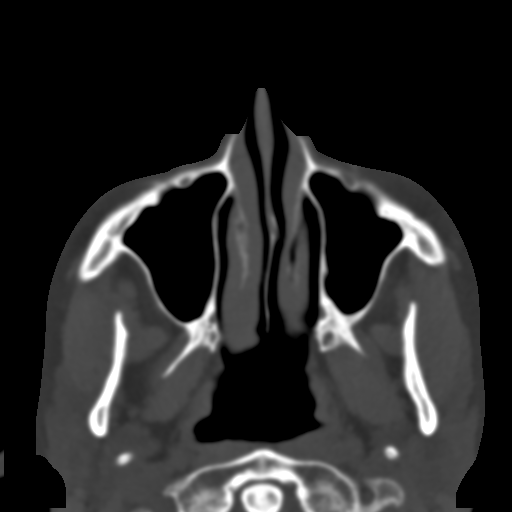
[im 53/85  bone]
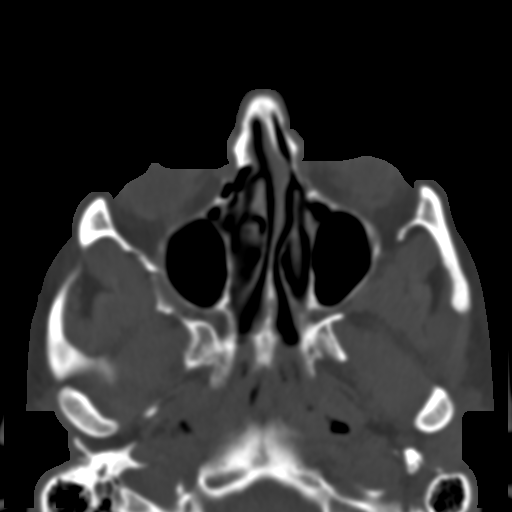
[im 61/85  bone]
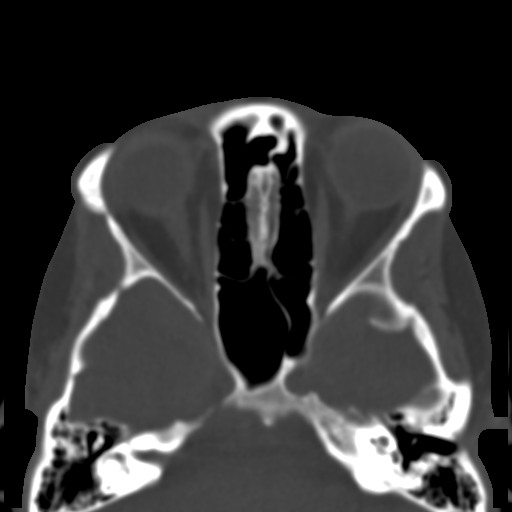
[im 70/85  bone]
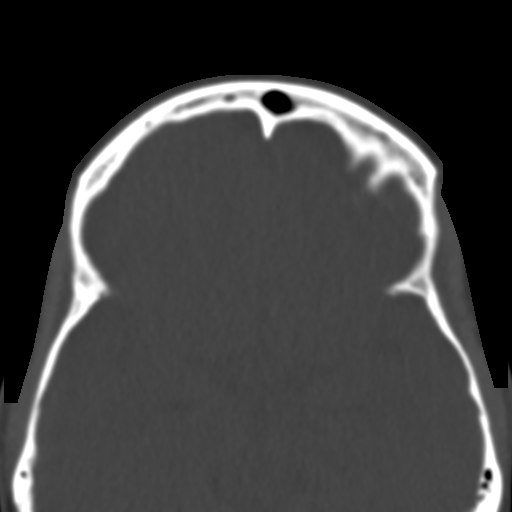
[im 79/85  brain]
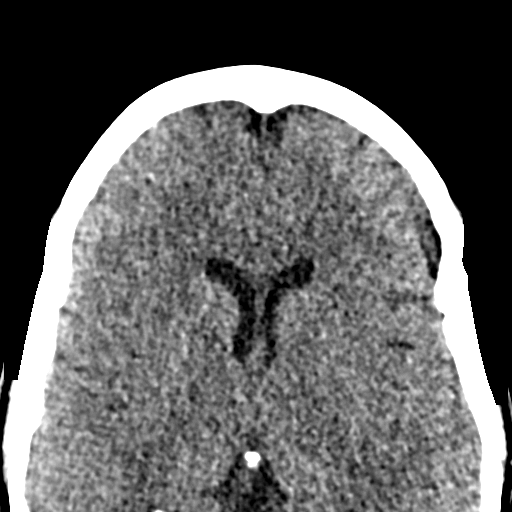
[im 79/85  bone]
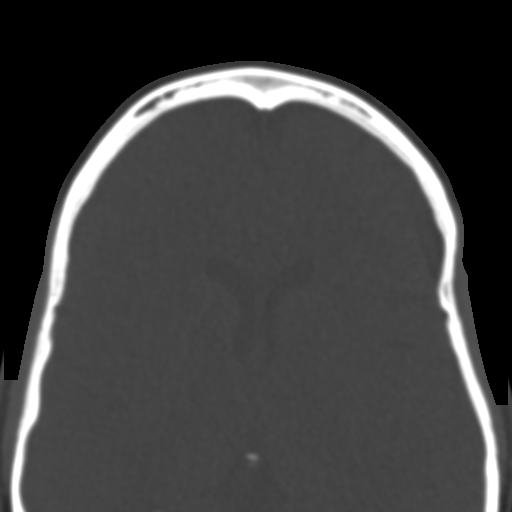

[Series 7: coronal soft · coronal · 0.34mm/px · 3 of 77 slices shown]
[im 26/77  bone]
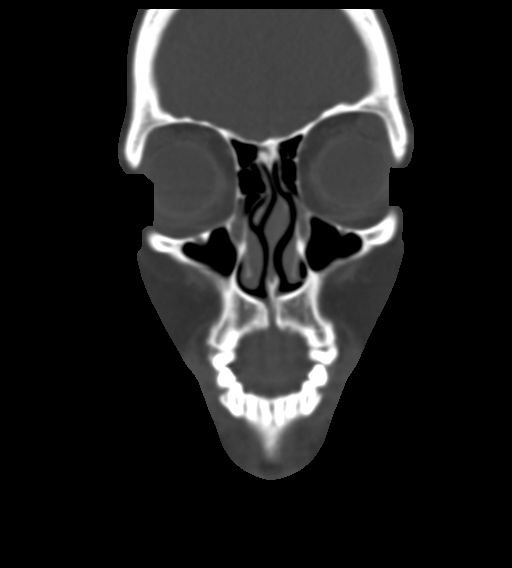
[im 34/77  bone]
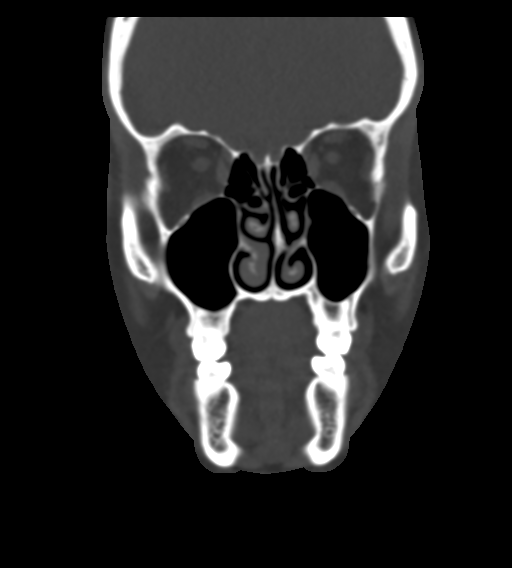
[im 43/77  bone]
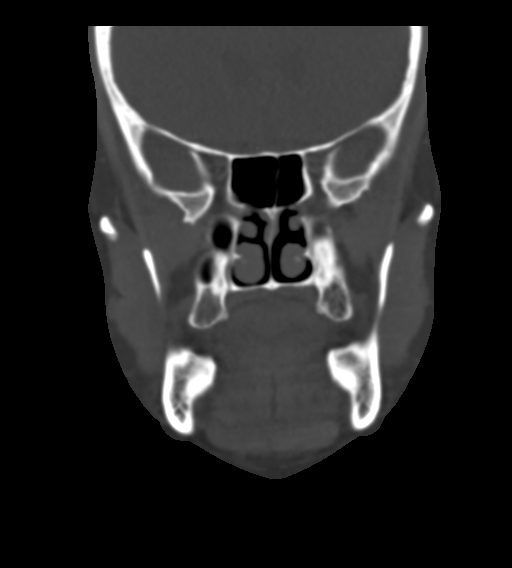

[Series 8: sagittal soft · sagittal · 0.33mm/px · 3 of 62 slices shown]
[im 21/62  bone]
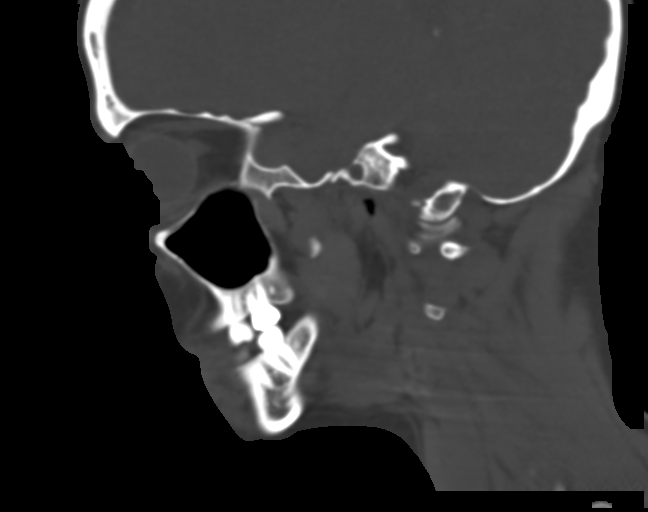
[im 31/62  bone]
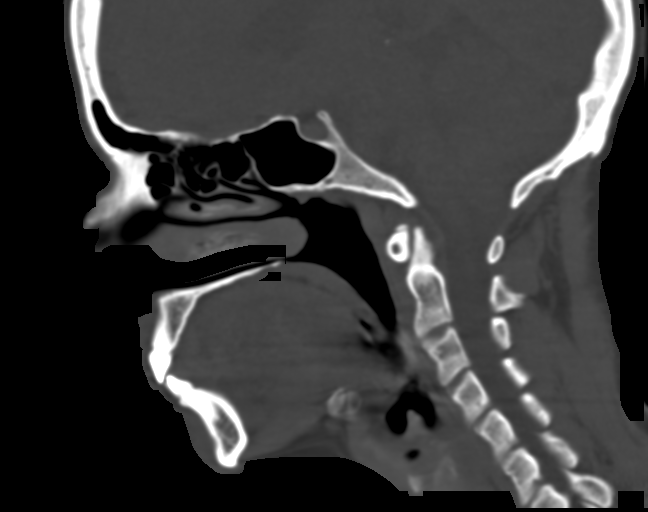
[im 41/62  bone]
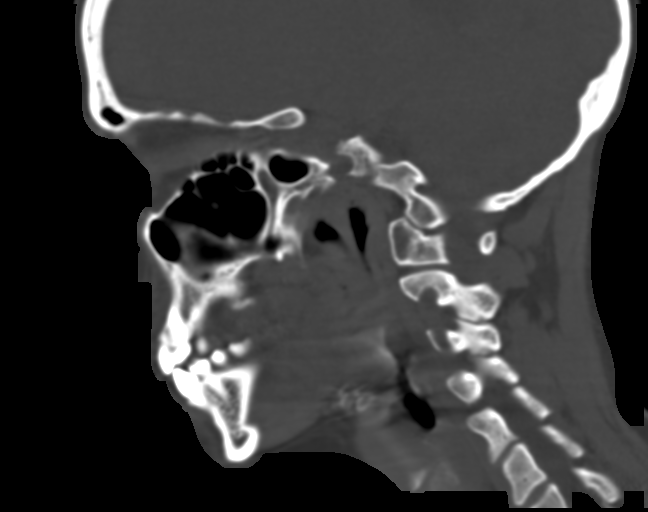

[15 of 47 positions shown; findings below may reference images not displayed]

FINDINGS: Paranasal sinuses:

Frontal: Normally aerated. Patent frontal sinus drainage pathways.

Ethmoid: Normally aerated.

Maxillary: Normally aerated.

Sphenoid: Normally aerated. Patent sphenoethmoidal recesses.

Right ostiomeatal unit: Patent.

Left ostiomeatal unit: Patent.

Nasal passages: Patent. Leftward nasal septal deviation extends 6 mm
from the midline without obstruction.

Anatomy: No pneumatization superior to anterior ethmoid notches.
Symmetric and intact olfactory grooves and fovea ethmoidalis, Keros
II (4-7mm) Sellar sphenoid pneumatization pattern. No dehiscence of
carotid or optic canals. No onodi cell.

Other: Orbits and intracranial compartment are unremarkable. Visible
mastoid air cells are normally aerated.
IMPRESSION: Normally aerated paranasal sinuses.  Patent sinus drainage pathways.

## 2020-10-03 ENCOUNTER — Other Ambulatory Visit (HOSPITAL_COMMUNITY): Payer: Self-pay | Admitting: Allergy and Immunology

## 2020-10-03 ENCOUNTER — Other Ambulatory Visit (HOSPITAL_COMMUNITY): Payer: Self-pay

## 2020-10-03 MED FILL — Desloratadine Tab 5 MG: ORAL | 30 days supply | Qty: 30 | Fill #0 | Status: AC

## 2020-10-06 ENCOUNTER — Other Ambulatory Visit (HOSPITAL_COMMUNITY): Payer: Self-pay

## 2020-10-22 DIAGNOSIS — S92354D Nondisplaced fracture of fifth metatarsal bone, right foot, subsequent encounter for fracture with routine healing: Secondary | ICD-10-CM | POA: Diagnosis not present

## 2020-12-01 DIAGNOSIS — Z23 Encounter for immunization: Secondary | ICD-10-CM | POA: Diagnosis not present

## 2020-12-01 DIAGNOSIS — R7989 Other specified abnormal findings of blood chemistry: Secondary | ICD-10-CM | POA: Diagnosis not present

## 2020-12-01 DIAGNOSIS — R82998 Other abnormal findings in urine: Secondary | ICD-10-CM | POA: Diagnosis not present

## 2020-12-01 DIAGNOSIS — Z0000 Encounter for general adult medical examination without abnormal findings: Secondary | ICD-10-CM | POA: Diagnosis not present

## 2020-12-04 ENCOUNTER — Other Ambulatory Visit (HOSPITAL_COMMUNITY): Payer: Self-pay | Admitting: Allergy and Immunology

## 2020-12-04 ENCOUNTER — Other Ambulatory Visit (HOSPITAL_COMMUNITY): Payer: Self-pay

## 2020-12-04 MED FILL — Desloratadine Tab 5 MG: ORAL | 30 days supply | Qty: 30 | Fill #1 | Status: AC

## 2020-12-05 ENCOUNTER — Other Ambulatory Visit (HOSPITAL_COMMUNITY): Payer: Self-pay

## 2021-01-15 ENCOUNTER — Other Ambulatory Visit (HOSPITAL_COMMUNITY): Payer: Self-pay

## 2021-01-15 MED FILL — Desloratadine Tab 5 MG: ORAL | 30 days supply | Qty: 30 | Fill #2 | Status: AC

## 2021-02-24 DIAGNOSIS — N898 Other specified noninflammatory disorders of vagina: Secondary | ICD-10-CM | POA: Diagnosis not present

## 2021-02-24 DIAGNOSIS — R350 Frequency of micturition: Secondary | ICD-10-CM | POA: Diagnosis not present

## 2021-02-24 DIAGNOSIS — R8290 Unspecified abnormal findings in urine: Secondary | ICD-10-CM | POA: Diagnosis not present

## 2021-02-24 DIAGNOSIS — R52 Pain, unspecified: Secondary | ICD-10-CM | POA: Diagnosis not present

## 2021-03-03 ENCOUNTER — Other Ambulatory Visit (HOSPITAL_COMMUNITY): Payer: Self-pay

## 2021-03-03 MED ORDER — DESLORATADINE 5 MG PO TABS
5.0000 mg | ORAL_TABLET | Freq: Every day | ORAL | 5 refills | Status: AC
  Filled 2021-03-03: qty 30, 30d supply, fill #0

## 2021-03-03 MED ORDER — NITROFURANTOIN MONOHYD MACRO 100 MG PO CAPS
100.0000 mg | ORAL_CAPSULE | Freq: Two times a day (BID) | ORAL | 0 refills | Status: AC
  Filled 2021-03-03: qty 14, 7d supply, fill #0

## 2021-03-05 ENCOUNTER — Other Ambulatory Visit (HOSPITAL_COMMUNITY): Payer: Self-pay

## 2021-05-08 ENCOUNTER — Other Ambulatory Visit (HOSPITAL_COMMUNITY): Payer: Self-pay

## 2022-12-12 IMAGING — DX DG FOOT COMPLETE 3+V*R*
3 series · 3 of 3 positions shown · non-contrast
Comparison: None.

CLINICAL DATA: Right foot pain and swelling, pain at base of fifth
metatarsal

EXAM:
RIGHT FOOT COMPLETE - 3+ VIEW

[foot ap]
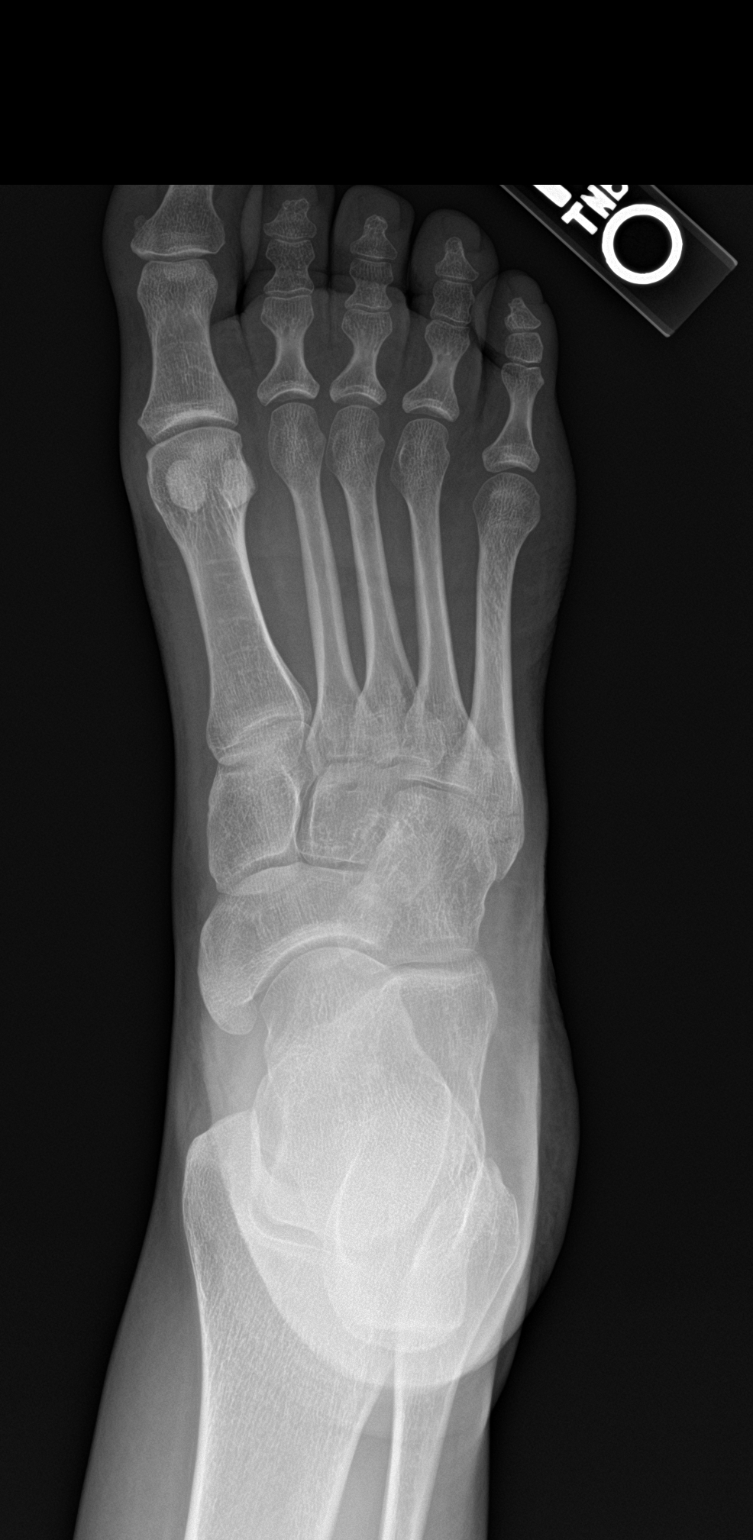

[foot obl]
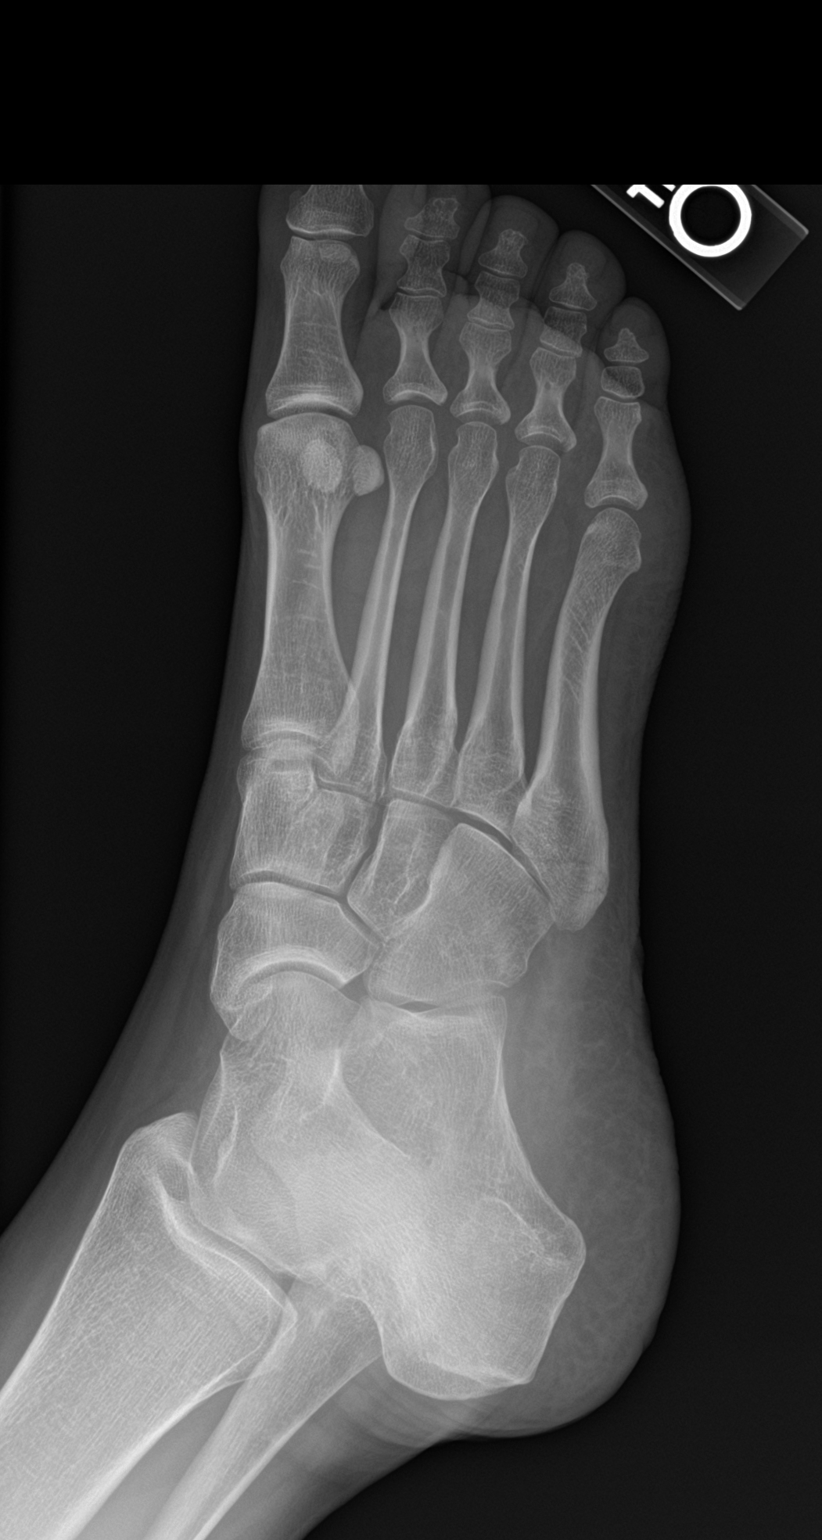

[foot lat]
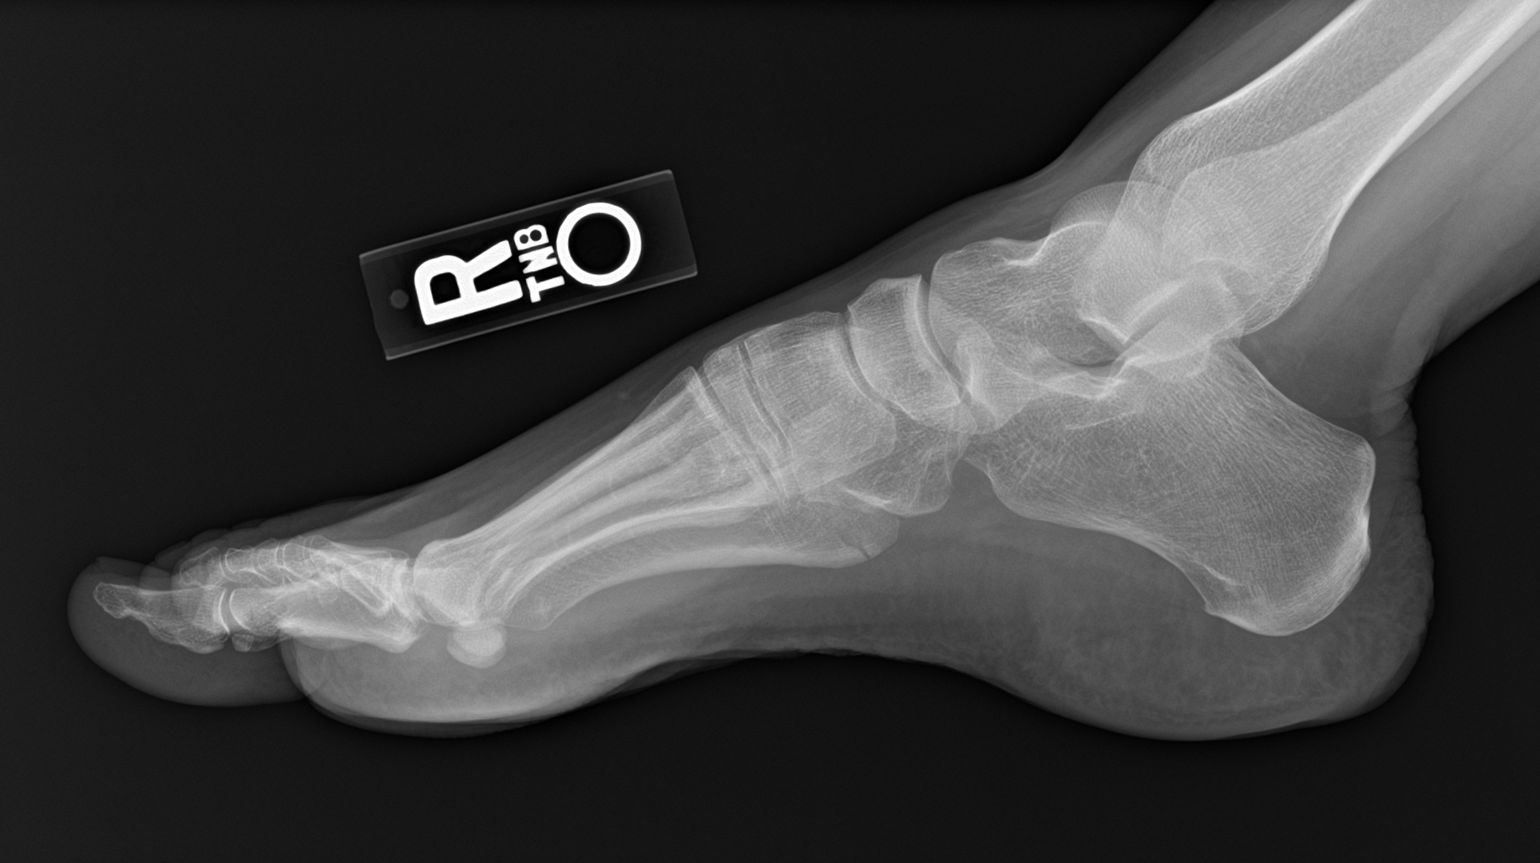

[3 of 3 positions shown; findings below may reference images not displayed]

FINDINGS: Frontal, oblique, lateral views of the right foot are obtained.
There is a minimally displaced transverse fracture through the base
of the fifth metatarsal, extending into the tarsometatarsal joint.
No significant distraction. Alignment is anatomic.

No other acute fractures. Joint spaces are well preserved. Soft
tissues are grossly normal.
IMPRESSION: 1. Minimally displaced intra-articular fracture at the base of the
fifth metatarsal.
